# Patient Record
Sex: Female | Born: 1950 | Race: White | Hispanic: No | Marital: Married | Smoking: Former smoker
Health system: Southern US, Community
[De-identification: ages and names within clinical notes are randomized; demographics above are authoritative.]

## PROBLEM LIST (undated history)

## (undated) DIAGNOSIS — I10 Essential (primary) hypertension: Secondary | ICD-10-CM

## (undated) DIAGNOSIS — N6002 Solitary cyst of left breast: Secondary | ICD-10-CM

## (undated) DIAGNOSIS — T4145XA Adverse effect of unspecified anesthetic, initial encounter: Secondary | ICD-10-CM

## (undated) DIAGNOSIS — J189 Pneumonia, unspecified organism: Secondary | ICD-10-CM

## (undated) DIAGNOSIS — J449 Chronic obstructive pulmonary disease, unspecified: Secondary | ICD-10-CM

## (undated) DIAGNOSIS — T8859XA Other complications of anesthesia, initial encounter: Secondary | ICD-10-CM

## (undated) DIAGNOSIS — Z9889 Other specified postprocedural states: Secondary | ICD-10-CM

## (undated) DIAGNOSIS — IMO0001 Reserved for inherently not codable concepts without codable children: Secondary | ICD-10-CM

## (undated) HISTORY — PX: TUBAL LIGATION: SHX77

---

## 1984-03-31 HISTORY — PX: BREAST SURGERY: SHX581

## 1993-03-31 DIAGNOSIS — N6002 Solitary cyst of left breast: Secondary | ICD-10-CM

## 1993-03-31 HISTORY — DX: Solitary cyst of left breast: N60.02

## 1998-07-16 ENCOUNTER — Ambulatory Visit (HOSPITAL_BASED_OUTPATIENT_CLINIC_OR_DEPARTMENT_OTHER): Admission: RE | Admit: 1998-07-16 | Discharge: 1998-07-16 | Payer: Self-pay | Admitting: Orthopedic Surgery

## 1999-09-13 ENCOUNTER — Encounter: Payer: Self-pay | Admitting: Unknown Physician Specialty

## 1999-09-13 ENCOUNTER — Encounter: Admission: RE | Admit: 1999-09-13 | Discharge: 1999-09-13 | Payer: Self-pay | Admitting: Unknown Physician Specialty

## 2002-05-16 ENCOUNTER — Encounter: Payer: Self-pay | Admitting: Unknown Physician Specialty

## 2002-05-16 ENCOUNTER — Encounter: Admission: RE | Admit: 2002-05-16 | Discharge: 2002-05-16 | Payer: Self-pay | Admitting: Unknown Physician Specialty

## 2002-12-27 ENCOUNTER — Encounter: Payer: Self-pay | Admitting: Emergency Medicine

## 2002-12-27 ENCOUNTER — Emergency Department (HOSPITAL_COMMUNITY): Admission: AC | Admit: 2002-12-27 | Discharge: 2002-12-27 | Payer: Self-pay

## 2003-11-30 DIAGNOSIS — Z9889 Other specified postprocedural states: Secondary | ICD-10-CM

## 2003-11-30 HISTORY — DX: Other specified postprocedural states: Z98.890

## 2011-08-04 ENCOUNTER — Encounter (HOSPITAL_BASED_OUTPATIENT_CLINIC_OR_DEPARTMENT_OTHER): Payer: Self-pay | Attending: Internal Medicine

## 2011-09-01 ENCOUNTER — Encounter (HOSPITAL_BASED_OUTPATIENT_CLINIC_OR_DEPARTMENT_OTHER): Payer: Self-pay | Attending: Internal Medicine

## 2014-01-02 DIAGNOSIS — Z6832 Body mass index (BMI) 32.0-32.9, adult: Secondary | ICD-10-CM | POA: Diagnosis not present

## 2014-01-02 DIAGNOSIS — F419 Anxiety disorder, unspecified: Secondary | ICD-10-CM | POA: Diagnosis not present

## 2014-01-02 DIAGNOSIS — I878 Other specified disorders of veins: Secondary | ICD-10-CM | POA: Diagnosis not present

## 2014-01-02 DIAGNOSIS — I10 Essential (primary) hypertension: Secondary | ICD-10-CM | POA: Diagnosis not present

## 2014-01-02 DIAGNOSIS — Z1389 Encounter for screening for other disorder: Secondary | ICD-10-CM | POA: Diagnosis not present

## 2014-01-02 DIAGNOSIS — H3581 Retinal edema: Secondary | ICD-10-CM | POA: Diagnosis not present

## 2014-01-02 DIAGNOSIS — H34832 Tributary (branch) retinal vein occlusion, left eye: Secondary | ICD-10-CM | POA: Diagnosis not present

## 2014-01-02 DIAGNOSIS — Z23 Encounter for immunization: Secondary | ICD-10-CM | POA: Diagnosis not present

## 2014-01-02 DIAGNOSIS — J449 Chronic obstructive pulmonary disease, unspecified: Secondary | ICD-10-CM | POA: Diagnosis not present

## 2014-01-02 DIAGNOSIS — M810 Age-related osteoporosis without current pathological fracture: Secondary | ICD-10-CM | POA: Diagnosis not present

## 2014-01-12 DIAGNOSIS — Z6832 Body mass index (BMI) 32.0-32.9, adult: Secondary | ICD-10-CM | POA: Diagnosis not present

## 2014-01-12 DIAGNOSIS — J449 Chronic obstructive pulmonary disease, unspecified: Secondary | ICD-10-CM | POA: Diagnosis not present

## 2014-02-08 DIAGNOSIS — Z6832 Body mass index (BMI) 32.0-32.9, adult: Secondary | ICD-10-CM | POA: Diagnosis not present

## 2014-02-08 DIAGNOSIS — M810 Age-related osteoporosis without current pathological fracture: Secondary | ICD-10-CM | POA: Diagnosis not present

## 2014-02-08 DIAGNOSIS — Z79899 Other long term (current) drug therapy: Secondary | ICD-10-CM | POA: Diagnosis not present

## 2014-03-02 ENCOUNTER — Ambulatory Visit
Admission: RE | Admit: 2014-03-02 | Discharge: 2014-03-02 | Disposition: A | Discharge status: Home / Self Care | Payer: Self-pay | Source: Ambulatory Visit | Attending: Family Medicine | Admitting: Family Medicine

## 2014-03-02 ENCOUNTER — Other Ambulatory Visit: Payer: Self-pay | Admitting: Family Medicine

## 2014-03-02 DIAGNOSIS — Z006 Encounter for examination for normal comparison and control in clinical research program: Secondary | ICD-10-CM

## 2014-03-03 DIAGNOSIS — I10 Essential (primary) hypertension: Secondary | ICD-10-CM | POA: Diagnosis not present

## 2014-03-03 DIAGNOSIS — M859 Disorder of bone density and structure, unspecified: Secondary | ICD-10-CM | POA: Diagnosis not present

## 2014-03-06 DIAGNOSIS — H3581 Retinal edema: Secondary | ICD-10-CM | POA: Diagnosis not present

## 2014-03-06 DIAGNOSIS — H34832 Tributary (branch) retinal vein occlusion, left eye: Secondary | ICD-10-CM | POA: Diagnosis not present

## 2014-03-07 ENCOUNTER — Encounter (HOSPITAL_COMMUNITY): Payer: Self-pay

## 2014-03-13 ENCOUNTER — Other Ambulatory Visit: Payer: Self-pay | Admitting: Internal Medicine

## 2014-03-13 DIAGNOSIS — M810 Age-related osteoporosis without current pathological fracture: Secondary | ICD-10-CM | POA: Diagnosis not present

## 2014-03-13 DIAGNOSIS — I10 Essential (primary) hypertension: Secondary | ICD-10-CM | POA: Diagnosis not present

## 2014-03-13 DIAGNOSIS — Z1231 Encounter for screening mammogram for malignant neoplasm of breast: Secondary | ICD-10-CM

## 2014-03-13 DIAGNOSIS — J449 Chronic obstructive pulmonary disease, unspecified: Secondary | ICD-10-CM | POA: Diagnosis not present

## 2014-03-13 DIAGNOSIS — H3530 Unspecified macular degeneration: Secondary | ICD-10-CM | POA: Diagnosis not present

## 2014-03-13 DIAGNOSIS — F419 Anxiety disorder, unspecified: Secondary | ICD-10-CM | POA: Diagnosis not present

## 2014-03-13 DIAGNOSIS — Z1389 Encounter for screening for other disorder: Secondary | ICD-10-CM | POA: Diagnosis not present

## 2014-03-13 DIAGNOSIS — E559 Vitamin D deficiency, unspecified: Secondary | ICD-10-CM | POA: Diagnosis not present

## 2014-03-13 DIAGNOSIS — Z0000 Encounter for general adult medical examination without abnormal findings: Secondary | ICD-10-CM | POA: Diagnosis not present

## 2014-03-15 DIAGNOSIS — Z1212 Encounter for screening for malignant neoplasm of rectum: Secondary | ICD-10-CM | POA: Diagnosis not present

## 2014-04-05 ENCOUNTER — Ambulatory Visit
Admission: RE | Admit: 2014-04-05 | Discharge: 2014-04-05 | Disposition: A | Discharge status: Home / Self Care | Payer: Self-pay | Source: Ambulatory Visit | Attending: Internal Medicine | Admitting: Internal Medicine

## 2014-04-05 DIAGNOSIS — Z1231 Encounter for screening mammogram for malignant neoplasm of breast: Secondary | ICD-10-CM | POA: Diagnosis not present

## 2014-05-03 DIAGNOSIS — Z79899 Other long term (current) drug therapy: Secondary | ICD-10-CM | POA: Diagnosis not present

## 2014-05-03 DIAGNOSIS — Z6832 Body mass index (BMI) 32.0-32.9, adult: Secondary | ICD-10-CM | POA: Diagnosis not present

## 2014-05-03 DIAGNOSIS — M810 Age-related osteoporosis without current pathological fracture: Secondary | ICD-10-CM | POA: Diagnosis not present

## 2014-05-03 DIAGNOSIS — E559 Vitamin D deficiency, unspecified: Secondary | ICD-10-CM | POA: Diagnosis not present

## 2014-05-18 DIAGNOSIS — H3581 Retinal edema: Secondary | ICD-10-CM | POA: Diagnosis not present

## 2014-05-18 DIAGNOSIS — H34832 Tributary (branch) retinal vein occlusion, left eye: Secondary | ICD-10-CM | POA: Diagnosis not present

## 2014-05-31 ENCOUNTER — Ambulatory Visit (HOSPITAL_COMMUNITY)
Admission: RE | Admit: 2014-05-31 | Discharge: 2014-05-31 | Disposition: A | Discharge status: Home / Self Care | Payer: Medicare Other | Source: Ambulatory Visit | Attending: Internal Medicine | Admitting: Internal Medicine

## 2014-05-31 ENCOUNTER — Encounter (HOSPITAL_COMMUNITY): Payer: Self-pay

## 2014-05-31 DIAGNOSIS — M810 Age-related osteoporosis without current pathological fracture: Secondary | ICD-10-CM | POA: Diagnosis not present

## 2014-05-31 HISTORY — DX: Essential (primary) hypertension: I10

## 2014-05-31 HISTORY — DX: Chronic obstructive pulmonary disease, unspecified: J44.9

## 2014-05-31 HISTORY — DX: Reserved for inherently not codable concepts without codable children: IMO0001

## 2014-05-31 MED ORDER — SODIUM CHLORIDE 0.9 % IV SOLN
250.0000 mL | Freq: Once | INTRAVENOUS | Status: AC
  Administered 2014-05-31: 250 mL via INTRAVENOUS

## 2014-05-31 MED ORDER — ZOLEDRONIC ACID 5 MG/100ML IV SOLN
5.0000 mg | Freq: Once | INTRAVENOUS | Status: AC
  Administered 2014-05-31: 5 mg via INTRAVENOUS
  Filled 2014-05-31: qty 100

## 2014-05-31 NOTE — Progress Notes (Signed)
Patient is doing COPD research and using their research inhalers

## 2014-06-05 ENCOUNTER — Ambulatory Visit (HOSPITAL_COMMUNITY): Payer: Medicare Other

## 2014-06-14 DIAGNOSIS — M810 Age-related osteoporosis without current pathological fracture: Secondary | ICD-10-CM | POA: Diagnosis not present

## 2014-06-14 DIAGNOSIS — E559 Vitamin D deficiency, unspecified: Secondary | ICD-10-CM | POA: Diagnosis not present

## 2014-07-17 DIAGNOSIS — Z6831 Body mass index (BMI) 31.0-31.9, adult: Secondary | ICD-10-CM | POA: Diagnosis not present

## 2014-07-17 DIAGNOSIS — J209 Acute bronchitis, unspecified: Secondary | ICD-10-CM | POA: Diagnosis not present

## 2014-07-17 DIAGNOSIS — R05 Cough: Secondary | ICD-10-CM | POA: Diagnosis not present

## 2014-07-17 DIAGNOSIS — R0602 Shortness of breath: Secondary | ICD-10-CM | POA: Diagnosis not present

## 2014-07-17 DIAGNOSIS — J45901 Unspecified asthma with (acute) exacerbation: Secondary | ICD-10-CM | POA: Diagnosis not present

## 2014-08-03 DIAGNOSIS — H3581 Retinal edema: Secondary | ICD-10-CM | POA: Diagnosis not present

## 2014-08-03 DIAGNOSIS — H34832 Tributary (branch) retinal vein occlusion, left eye: Secondary | ICD-10-CM | POA: Diagnosis not present

## 2014-09-05 DIAGNOSIS — Z6832 Body mass index (BMI) 32.0-32.9, adult: Secondary | ICD-10-CM | POA: Diagnosis not present

## 2014-09-05 DIAGNOSIS — J449 Chronic obstructive pulmonary disease, unspecified: Secondary | ICD-10-CM | POA: Diagnosis not present

## 2014-10-19 DIAGNOSIS — H3581 Retinal edema: Secondary | ICD-10-CM | POA: Diagnosis not present

## 2014-10-19 DIAGNOSIS — H34832 Tributary (branch) retinal vein occlusion, left eye: Secondary | ICD-10-CM | POA: Diagnosis not present

## 2014-12-28 DIAGNOSIS — S83241A Other tear of medial meniscus, current injury, right knee, initial encounter: Secondary | ICD-10-CM | POA: Diagnosis not present

## 2015-01-04 DIAGNOSIS — H34832 Tributary (branch) retinal vein occlusion, left eye, with macular edema: Secondary | ICD-10-CM | POA: Diagnosis not present

## 2015-01-06 DIAGNOSIS — J45901 Unspecified asthma with (acute) exacerbation: Secondary | ICD-10-CM | POA: Diagnosis not present

## 2015-01-27 DIAGNOSIS — Z23 Encounter for immunization: Secondary | ICD-10-CM | POA: Diagnosis not present

## 2015-03-08 ENCOUNTER — Other Ambulatory Visit: Payer: Self-pay

## 2015-03-08 DIAGNOSIS — Z1231 Encounter for screening mammogram for malignant neoplasm of breast: Secondary | ICD-10-CM

## 2015-03-12 DIAGNOSIS — R7301 Impaired fasting glucose: Secondary | ICD-10-CM | POA: Diagnosis not present

## 2015-03-12 DIAGNOSIS — E559 Vitamin D deficiency, unspecified: Secondary | ICD-10-CM | POA: Diagnosis not present

## 2015-03-12 DIAGNOSIS — N390 Urinary tract infection, site not specified: Secondary | ICD-10-CM | POA: Diagnosis not present

## 2015-03-12 DIAGNOSIS — R8290 Unspecified abnormal findings in urine: Secondary | ICD-10-CM | POA: Diagnosis not present

## 2015-03-12 DIAGNOSIS — I10 Essential (primary) hypertension: Secondary | ICD-10-CM | POA: Diagnosis not present

## 2015-03-16 DIAGNOSIS — E559 Vitamin D deficiency, unspecified: Secondary | ICD-10-CM | POA: Diagnosis not present

## 2015-03-16 DIAGNOSIS — Z6833 Body mass index (BMI) 33.0-33.9, adult: Secondary | ICD-10-CM | POA: Diagnosis not present

## 2015-03-16 DIAGNOSIS — J449 Chronic obstructive pulmonary disease, unspecified: Secondary | ICD-10-CM | POA: Diagnosis not present

## 2015-03-16 DIAGNOSIS — Z23 Encounter for immunization: Secondary | ICD-10-CM | POA: Diagnosis not present

## 2015-03-16 DIAGNOSIS — I10 Essential (primary) hypertension: Secondary | ICD-10-CM | POA: Diagnosis not present

## 2015-03-16 DIAGNOSIS — Z0000 Encounter for general adult medical examination without abnormal findings: Secondary | ICD-10-CM | POA: Diagnosis not present

## 2015-03-16 DIAGNOSIS — R7301 Impaired fasting glucose: Secondary | ICD-10-CM | POA: Diagnosis not present

## 2015-03-16 DIAGNOSIS — M810 Age-related osteoporosis without current pathological fracture: Secondary | ICD-10-CM | POA: Diagnosis not present

## 2015-03-16 DIAGNOSIS — H3530 Unspecified macular degeneration: Secondary | ICD-10-CM | POA: Diagnosis not present

## 2015-03-16 DIAGNOSIS — F419 Anxiety disorder, unspecified: Secondary | ICD-10-CM | POA: Diagnosis not present

## 2015-03-22 DIAGNOSIS — H34832 Tributary (branch) retinal vein occlusion, left eye, with macular edema: Secondary | ICD-10-CM | POA: Diagnosis not present

## 2015-04-03 ENCOUNTER — Telehealth (HOSPITAL_COMMUNITY): Payer: Self-pay

## 2015-04-03 NOTE — Telephone Encounter (Signed)
I have called and left a message with Aslan to inquire about participation in Pulmonary Rehab per Dr. Holwerda's referral. Will send letter in mail and follow up. 

## 2015-04-05 ENCOUNTER — Telehealth (HOSPITAL_COMMUNITY): Payer: Self-pay

## 2015-04-05 DIAGNOSIS — Z6833 Body mass index (BMI) 33.0-33.9, adult: Secondary | ICD-10-CM | POA: Diagnosis not present

## 2015-04-05 DIAGNOSIS — J019 Acute sinusitis, unspecified: Secondary | ICD-10-CM | POA: Diagnosis not present

## 2015-04-05 DIAGNOSIS — Z1389 Encounter for screening for other disorder: Secondary | ICD-10-CM | POA: Diagnosis not present

## 2015-04-05 NOTE — Telephone Encounter (Signed)
I have called and left a message with Sharma CovertLanice to inquire about participation in Pulmonary Rehab per Dr. Alphonsus SiasHolwerda's referral. Will send letter in mail and follow up.

## 2015-04-10 ENCOUNTER — Ambulatory Visit: Payer: Self-pay

## 2015-04-25 ENCOUNTER — Ambulatory Visit
Admission: RE | Admit: 2015-04-25 | Discharge: 2015-04-25 | Disposition: A | Discharge status: Home / Self Care | Payer: Medicare Other | Source: Ambulatory Visit

## 2015-04-25 DIAGNOSIS — Z1231 Encounter for screening mammogram for malignant neoplasm of breast: Secondary | ICD-10-CM | POA: Diagnosis not present

## 2015-04-30 ENCOUNTER — Encounter (HOSPITAL_COMMUNITY)
Admission: RE | Admit: 2015-04-30 | Discharge: 2015-04-30 | Disposition: A | Discharge status: Home / Self Care | Payer: Medicare Other | Source: Ambulatory Visit | Attending: Internal Medicine | Admitting: Internal Medicine

## 2015-04-30 ENCOUNTER — Encounter (HOSPITAL_COMMUNITY): Payer: Self-pay

## 2015-04-30 DIAGNOSIS — J45909 Unspecified asthma, uncomplicated: Secondary | ICD-10-CM | POA: Insufficient documentation

## 2015-04-30 DIAGNOSIS — R0602 Shortness of breath: Secondary | ICD-10-CM | POA: Insufficient documentation

## 2015-04-30 DIAGNOSIS — R0600 Dyspnea, unspecified: Secondary | ICD-10-CM

## 2015-04-30 HISTORY — DX: Solitary cyst of left breast: N60.02

## 2015-04-30 HISTORY — DX: Other specified postprocedural states: Z98.890

## 2015-04-30 NOTE — Progress Notes (Signed)
Meghan [name] 65 y.o. female Pulmonary Rehab Orientation Note Patient arrived today in [facility] for orientation to Pulmonary Rehab. She was transported from [facility] via wheel chair accompanied by her husband. She has not been prescribed oxygen for home use. Color good, skin warm and dry. Patient is oriented to time and place. Patient's medical history, psychosocial health, and medications reviewed. Psychosocial assessment reveals pt lives with their spouse. They have 2 daughters, one locally and one lives in [place] with her family. They have multiple grandchildren. Pt is currently retired from [employer]. Pt hobbies include looking at old houses with her husband, visiting the residents of nursing homes, and caring for their new dog. Pt reports her stress level is extremely low. She states she really does not have any aspects of her life that causes her worry or anxiety. She states she is very fortunate to be in good health except for her COPD/Asthma/Dyspnea.  Pt does not exhibit signs of depression. PHQ2/9 score 0/na. Pt shows good  coping skills with positive outlook . She was offered emotional support and reassurance. Will continue to monitor and evaluate progress toward psychosocial goal(s) of maintaining a low stress lifestyle and remaining positive about her future. Physical assessment reveals heart rate is normal, S1S2 present. Breath sounds clear to auscultation, no wheezes, rales, or rhonchi. Grip strength equal, strong. Distal pulses palpable. Discoloration noted to lower legs bilat R>L. Mild non-pitting edema also noted to her ankles and lower legs R>L. Patient states the skin to painful to touch on the right leg and MD is aware. Encouraged patient to wear compression stockings for her venous stasis, but she states it is too painful to put them on.  Patient reports she does take medications as prescribed. Patient states she follows a Regular diet. The patient reports no  specific efforts to gain or lose weight. One of her personal goals while in the program is to begin healthy weight loss through diet and exercise. Patient's weight will be monitored closely. Demonstration and practice of PLB using pulse oximeter. Patient able to return demonstration satisfactorily. Safety and hand hygiene in the exercise area reviewed with patient. Patient voices understanding of the information reviewed. Department expectations discussed with patient and achievable goals were set. The patient shows enthusiasm about attending the program and we look forward to working with this nice lady. The patient is scheduled for a 6 min walk test on Tuesday, 1/30 at 4:00 and to begin exercise on Thursday 2/2 in the 1:30 class.   45 minutes was spent on a variety of activities such as assessment of the patient, obtaining baseline data including height, weight, BMI, and grip strength, verifying medical history, allergies, and current medications, and teaching patient strategies for performing tasks with less respiratory effort with emphasis on pursed lip breathing.

## 2015-05-01 ENCOUNTER — Encounter (HOSPITAL_COMMUNITY)
Admission: RE | Admit: 2015-05-01 | Discharge: 2015-05-01 | Disposition: A | Discharge status: Home / Self Care | Payer: Medicare Other | Source: Ambulatory Visit | Attending: Internal Medicine | Admitting: Internal Medicine

## 2015-05-01 DIAGNOSIS — R0602 Shortness of breath: Secondary | ICD-10-CM | POA: Diagnosis not present

## 2015-05-01 NOTE — Progress Notes (Signed)
Meghan [name] completed a Six-Minute Walk Test on 05/01/15 . Tu walked 835 feet with 1 rest break for 1 minute.  The patient's lowest oxygen saturation was 86 %, highest heart rate was 109 bpm , and highest blood pressure was 196/94. The patient was on room ait. Patient stated that shortness of breath hindered her walk test.  Pt was introduced to pursed lip breathing after walk test.  We will monitor oxygen saturation and blood pressure closely during exercise. Fabio Pierce, MA, ACSM RCEP

## 2015-05-03 ENCOUNTER — Encounter (HOSPITAL_COMMUNITY)
Admission: RE | Admit: 2015-05-03 | Discharge: 2015-05-03 | Disposition: A | Discharge status: Home / Self Care | Payer: Medicare Other | Source: Ambulatory Visit | Attending: Internal Medicine | Admitting: Internal Medicine

## 2015-05-03 DIAGNOSIS — R0602 Shortness of breath: Secondary | ICD-10-CM | POA: Insufficient documentation

## 2015-05-03 NOTE — Progress Notes (Signed)
Today, Meghan [name] exercised at [employer]. [name]. Service time was from 1330 to 1515.  The patient exercised by performing aerobic, strengthening, and stretching exercises. Oxygen saturation, heart rate, blood pressure, rate of perceived exertion, and shortness of breath were all monitored before, during, and after exercise. [name] presented with no problems at today's exercise session. [name] also attended an education session with the respiratory therapist on pulmonary medications and PLB.  The patient did not have an increase in workload intensity during today's exercise session.  Pre-exercise vitals: . Weight kg: 84.9 . Liters of O2: ra . SpO2: 93 . HR: 78 . BP: 144/72 . CBG: na  Exercise vitals: . Highest heartrate:  101 . Lowest oxygen saturation: 86 increased to 90 with PLB . Highest blood pressure: 132/70 . Liters of 02: ra  Post-exercise vitals: . SpO2: 95 . HR: 87 . BP: 124/72 . Liters of O2: ra . CBG: na  Dr. Alyson Reedy, Medical Director Dr. Ardyth Harps is immediately available during today's Pulmonary Rehab session for [facility] on 05/03/2015 at 1330 class time.

## 2015-05-08 ENCOUNTER — Encounter (HOSPITAL_COMMUNITY)
Admission: RE | Admit: 2015-05-08 | Discharge: 2015-05-08 | Disposition: A | Discharge status: Home / Self Care | Payer: Medicare Other | Source: Ambulatory Visit | Attending: Internal Medicine | Admitting: Internal Medicine

## 2015-05-08 DIAGNOSIS — E559 Vitamin D deficiency, unspecified: Secondary | ICD-10-CM | POA: Diagnosis not present

## 2015-05-08 DIAGNOSIS — M810 Age-related osteoporosis without current pathological fracture: Secondary | ICD-10-CM | POA: Diagnosis not present

## 2015-05-08 DIAGNOSIS — Z6833 Body mass index (BMI) 33.0-33.9, adult: Secondary | ICD-10-CM | POA: Diagnosis not present

## 2015-05-08 DIAGNOSIS — R0602 Shortness of breath: Secondary | ICD-10-CM | POA: Diagnosis not present

## 2015-05-08 NOTE — Progress Notes (Signed)
Today, Meghan [name] exercised at [facility]. Service time was from 1330 to 1510.  The patient exercised by performing aerobic, strengthening, and stretching exercises. Oxygen saturation, heart rate, blood pressure, rate of perceived exertion, and shortness of breath were all monitored before, during, and after exercise. Meghan [name] presented with no problems at today's exercise session.  The patient did not have an increase in workload intensity during today's exercise session.  Pre-exercise vitals: . Weight kg: 84.8 . Liters of O2: ra . SpO2: 93 . HR: 82 . BP: 136/70 . CBG: na  Exercise vitals: . Highest heartrate:  111 . Lowest oxygen saturation: 85 which increased to 91 with rest and purse lip breathing . Highest blood pressure: 162/80 . Liters of 02: ra  Post-exercise vitals: . SpO2: 94 . HR: 84 . BP: 144/80 . Liters of O2: ra . CBG: na Dr. Alyson Reedy, Medical Director Dr. [name] is immediately available during today's Pulmonary Rehab session for Meghan [name] on 05/08/2015  at 1330 class time  .

## 2015-05-10 ENCOUNTER — Encounter (HOSPITAL_COMMUNITY)
Admission: RE | Admit: 2015-05-10 | Discharge: 2015-05-10 | Disposition: A | Discharge status: Home / Self Care | Payer: Medicare Other | Source: Ambulatory Visit | Attending: Internal Medicine | Admitting: Internal Medicine

## 2015-05-10 DIAGNOSIS — R0602 Shortness of breath: Secondary | ICD-10-CM | POA: Diagnosis not present

## 2015-05-10 NOTE — Progress Notes (Signed)
Today, Meghan [name] exercised at [facility] [facility]. Service time was from 1330 to 1520.  The patient exercised by performing aerobic, strengthening, and stretching exercises. Oxygen saturation, heart rate, blood pressure, rate of perceived exertion, and shortness of breath were all monitored before, during, and after exercise. Meghan [name] presented with no problems at today's exercise session. Meghan [name] also attended an education with [facility] on oxygen equipment.  The patient did not have an increase in workload intensity during today's exercise session.  Pre-exercise vitals: . Weight kg: 84.9 . Liters of O2: ra . SpO2: 93 . HR: 85 . BP: 124/70 . CBG: na  Exercise vitals: . Highest heartrate:  104 . Lowest oxygen saturation: 88 . Highest blood pressure: 122/70 . Liters of 02: ra  Post-exercise vitals: . SpO2: 97 . HR: 89 . BP: 118/76 . Liters of O2: ra . CBG: na  Dr. Alyson Reedy, Medical Director Dr. [name] is immediately available during today's Pulmonary Rehab session for [facility] on 05/10/2015 at 1330 class time.

## 2015-05-15 ENCOUNTER — Encounter (HOSPITAL_COMMUNITY)
Admission: RE | Admit: 2015-05-15 | Discharge: 2015-05-15 | Disposition: A | Discharge status: Home / Self Care | Payer: Medicare Other | Source: Ambulatory Visit | Attending: Internal Medicine | Admitting: Internal Medicine

## 2015-05-15 DIAGNOSIS — R0602 Shortness of breath: Secondary | ICD-10-CM | POA: Diagnosis not present

## 2015-05-15 NOTE — Progress Notes (Signed)
Today, Meghan [name] exercised at [employer] [facility]. Service time was from 1330 to 1510.  The patient exercised by performing aerobic, strengthening, and stretching exercises. Oxygen saturation, heart rate, blood pressure, rate of perceived exertion, and shortness of breath were all monitored before, during, and after exercise. Meghan [name] presented with no problems at today's exercise session.  The patient did  have an increase in workload intensity during today's exercise session.  Pre-exercise vitals: . Weight kg: 84.3 . Liters of O2: RA . SpO2: 97 . HR: 80 . BP: 130/80 . CBG: NA  Exercise vitals: . Highest heartrate:  109 . Lowest oxygen saturation: 90 . Highest blood pressure: 110/70 . Liters of 02: RA  Post-exercise vitals: . SpO2: 96 . HR: 87 . BP: 122/78 . Liters of O2: RA . CBG: NA Dr. Alyson Reedy, Medical Director Dr. [name] is immediately available during today's Pulmonary Rehab session for [facility] on 05/15/2015  at 1330 class time  .

## 2015-05-17 ENCOUNTER — Encounter (HOSPITAL_COMMUNITY)
Admission: RE | Admit: 2015-05-17 | Discharge: 2015-05-17 | Disposition: A | Discharge status: Home / Self Care | Payer: Medicare Other | Source: Ambulatory Visit | Attending: Internal Medicine | Admitting: Internal Medicine

## 2015-05-17 ENCOUNTER — Other Ambulatory Visit (HOSPITAL_COMMUNITY): Payer: Self-pay | Admitting: *Deleted

## 2015-05-17 DIAGNOSIS — R0602 Shortness of breath: Secondary | ICD-10-CM | POA: Diagnosis not present

## 2015-05-17 NOTE — Progress Notes (Signed)
Today, Meghan [name] exercised at [employer] [facility]. Service time was from 1330 to 1515.  The patient exercised by performing aerobic, strengthening, and stretching exercises. Oxygen saturation, heart rate, blood pressure, rate of perceived exertion, and shortness of breath were all monitored before, during, and after exercise. Meghan [name] presented with no problems at today's exercise session. She attended warning signs and symptoms class today.  The patient did not have an increase in workload intensity during today's exercise session.  Pre-exercise vitals: . Weight kg: 84.5 . Liters of O2: RA . SpO2: 95 . HR: 82 . BP: 130/64 . CBG: NA  Exercise vitals: . Highest heartrate:  94 . Lowest oxygen saturation: 90 . Highest blood pressure: 144/72 . Liters of 02: RA  Post-exercise vitals: . SpO2: 99 . HR: 83 . BP: 120/60 . Liters of O2: RA . CBG: NA Dr. Alyson Reedy, Medical Director Dr. Benjamine Mola is immediately available during today's Pulmonary Rehab session for [facility] on 05/17/2015  at 1330 class time   .

## 2015-05-17 NOTE — Progress Notes (Signed)
Antoine Alviar 65 y.o. female Nutrition Note Spoke with pt. Pt is obese. Pt eats 1 meal a day; most prepared at home. Discussed how eating more frequently is beneficial for pulmonary patients. There are some ways the pt can make her eating habits healthier.  Pt's Rate Your Plate results reviewed with pt. Pt does not avoid salty food; uses her own canned food.  Pt does not add salt to food. The role of sodium in lung disease reviewed with pt. Pt expressed understanding of the information reviewed.  No results found for: HGBA1C  Nutrition Diagnosis ? Food-and nutrition-related knowledge deficit related to lack of exposure to information as related to diagnosis of pulmonary disease ? Obesity related to excessive energy intake as evidenced by a BMI of 33.6 ?  Nutrition Intervention ? Pt's individual nutrition plan and goals reviewed with pt. ? Benefits of adopting healthy eating habits discussed when pt's Rate Your Plate reviewed. ? Pt to attend the Nutrition and Lung Disease class ? Continual client-centered nutrition education by RD, as part of interdisciplinary care. Goal(s) 1. Identify food quantities necessary to achieve wt loss of  -2# per week to a goal wt loss of 2.7-10.9 kg (6-24 lb) at graduation from pulmonary rehab. 2. Describe the benefit of including fruits, vegetables, whole grains, and low-fat dairy products in a healthy meal plan. Monitor and Evaluate progress toward nutrition goal with team.   Mickle Plumb, M.Ed, RD, LDN, CDE 05/17/2015 2:47 PM

## 2015-05-18 ENCOUNTER — Ambulatory Visit (HOSPITAL_COMMUNITY)
Admission: RE | Admit: 2015-05-18 | Discharge: 2015-05-18 | Disposition: A | Discharge status: Home / Self Care | Payer: Medicare Other | Source: Ambulatory Visit | Attending: Internal Medicine | Admitting: Internal Medicine

## 2015-05-18 DIAGNOSIS — M810 Age-related osteoporosis without current pathological fracture: Secondary | ICD-10-CM | POA: Insufficient documentation

## 2015-05-18 MED ORDER — ZOLEDRONIC ACID 5 MG/100ML IV SOLN: 5.0000 mg | Freq: Once | INTRAVENOUS | Status: DC

## 2015-05-18 MED ORDER — ZOLEDRONIC ACID 5 MG/100ML IV SOLN
INTRAVENOUS | Status: AC
  Administered 2015-05-18: 5 mg
  Filled 2015-05-18: qty 100

## 2015-05-22 ENCOUNTER — Encounter (HOSPITAL_COMMUNITY)
Admission: RE | Admit: 2015-05-22 | Discharge: 2015-05-22 | Disposition: A | Discharge status: Home / Self Care | Payer: Medicare Other | Source: Ambulatory Visit | Attending: Internal Medicine | Admitting: Internal Medicine

## 2015-05-22 DIAGNOSIS — R0602 Shortness of breath: Secondary | ICD-10-CM | POA: Diagnosis not present

## 2015-05-22 NOTE — Progress Notes (Signed)
Today, Meghan [name] exercised at [employer]. Service time was from 1330 to 1455.  The patient exercised by performing aerobic, strengthening, and stretching exercises. Oxygen saturation, heart rate, blood pressure, rate of perceived exertion, and shortness of breath were all monitored before, during, and after exercise. Meghan [name] presented with no problems at today's exercise session.  The patient did  have an increase in workload intensity during today's exercise session.  Pre-exercise vitals: . Weight kg: 85.2 . Liters of O2: ra . SpO2: 96 . HR: 87 . BP: 118/60 . CBG: na  Exercise vitals: . Highest heartrate:  105 . Lowest oxygen saturation: 93 . Highest blood pressure: 122/70 . Liters of 02: ra  Post-exercise vitals: . SpO2: 95 . HR: 91 . BP: 124/70 . Liters of O2: ra . CBG: na Dr. Alyson Reedy, Medical Director Dr. [name] is immediately available during today's Pulmonary Rehab session for [facility] on 05/22/2015  at 1330 class time  .

## 2015-05-24 ENCOUNTER — Encounter (HOSPITAL_COMMUNITY)
Admission: RE | Admit: 2015-05-24 | Discharge: 2015-05-24 | Disposition: A | Discharge status: Home / Self Care | Payer: Medicare Other | Source: Ambulatory Visit | Attending: Internal Medicine | Admitting: Internal Medicine

## 2015-05-24 DIAGNOSIS — R0602 Shortness of breath: Secondary | ICD-10-CM | POA: Diagnosis not present

## 2015-05-24 NOTE — Progress Notes (Signed)
I have reviewed a Home Exercise Prescription with [facility] . [name] is currently not exercising at home. However, pt is attending Pulmonary Rehab 2x/week. The patient was advised to walk or attend [employer] with spouse 2 days a week for 30 minutes.  [name] and I discussed how to progress her exercise prescription.  The patient stated that their goals were to lose 10lbs and continue with PLB and diaphragmatic breathing.  The patient stated that they understand the exercise prescription.  We reviewed exercise guidelines, target heart rate during exercise, oxygen use, weather, home pulse oximeter, endpoints for exercise, and goals.  Patient is encouraged to come to me with any questions. I will continue to follow up with the patient to assist them with progression and safety.     Meghan [name] [employer]

## 2015-05-24 NOTE — Progress Notes (Signed)
Today, Meghan [name] exercised at [employer]. Service time was from 1330 to 1525.  The patient exercised by performing aerobic, strengthening, and stretching exercises. Oxygen saturation, heart rate, blood pressure, rate of perceived exertion, and shortness of breath were all monitored before, during, and after exercise. Meghan [name] presented with no problems at today's exercise session. She attended exercise for the pulmonary patient class today.  The patient did not have an increase in workload intensity during today's exercise session.  Pre-exercise vitals: . Weight kg: 85.0 . Liters of O2: RA . SpO2: 96 . HR: 79 . BP: 138/70 . CBG: NA  Exercise vitals: . Highest heartrate:  92 . Lowest oxygen saturation: 89 . Highest blood pressure: 110/70 . Liters of 02: RA  Post-exercise vitals: . SpO2: 97 . HR: 84 . BP: 108/74 . Liters of O2: RA . CBG: NA Dr. Alyson Reedy, Medical Director Dr. [name] is immediately available during today's Pulmonary Rehab session for [employer] [facility] on 05/24/2015  at 1330 class time.  Marland Kitchen

## 2015-05-29 ENCOUNTER — Encounter (HOSPITAL_COMMUNITY)
Admission: RE | Admit: 2015-05-29 | Discharge: 2015-05-29 | Disposition: A | Discharge status: Home / Self Care | Payer: Medicare Other | Source: Ambulatory Visit | Attending: Internal Medicine | Admitting: Internal Medicine

## 2015-05-29 DIAGNOSIS — R0602 Shortness of breath: Secondary | ICD-10-CM | POA: Diagnosis not present

## 2015-05-29 NOTE — Progress Notes (Signed)
Daily Session Note  Patient Details  Name: Meghan [name] MRN: [id number] Date of Birth: 05-Apr-1950 Referring Provider:  Velna Hatchet, MD  Encounter Date: 05/29/2015  Check In:     Session Check In - 05/29/15 1334    Check-In   Location [facility]   Staff Present Rosebud Poles, RN, Deland Pretty, MS, ACSM CEP, Exercise Physiologist;Annedrea Rosezella [place], RN, MHA;Portia Rollene Rotunda, RN, BSN   Supervising physician immediately available to respond to emergencies [facility] immediately available   Physician(s) Dr. Marily Memos   Medication changes reported     No   Fall or balance concerns reported    No   Warm-up and Cool-down Performed as group-led instruction   Resistance Training Performed Yes   VAD Patient? No   Pain Assessment   Currently in Pain? No/denies      Capillary Blood Glucose: No results found for this or any previous visit (from the past 24 hour(s)).      Exercise Prescription Changes - 05/29/15 1500    Exercise Review   Progression No   Response to Exercise   Blood Pressure (Admit) 102/64 mmHg   Blood Pressure (Exercise) 144/68 mmHg   Blood Pressure (Exit) 110/70 mmHg   Heart Rate (Admit) 85 bpm   Heart Rate (Exercise) 108 bpm   Heart Rate (Exit) 95 bpm   Oxygen Saturation (Admit) 96 %   Oxygen Saturation (Exercise) 89 %   Oxygen Saturation (Exit) 95 %   Rating of Perceived Exertion (Exercise) 11   Perceived Dyspnea (Exercise) 1   Symptoms unusal fatigue   Comments house cleaning before exercise   Duration Progress to 45 minutes of aerobic exercise without signs/symptoms of physical distress   Intensity THRR unchanged  40-80% TRR   Progression   Progression Continue to progress workloads to maintain intensity without signs/symptoms of physical distress.   Resistance Training   Training Prescription Yes   Weight orange bands   Reps 10-12   Interval Training   Interval Training No   NuStep   Level 3   Minutes 15   METs  2.1   Arm Ergometer   Level 2   Minutes 15   Track   Laps 7   Minutes 15     Goals Met:  Using PLB without cueing & demonstrates good technique No report of cardiac concerns or symptoms Strength training completed today  Goals Unmet:  Did not meet her stamina goal due to weekend houssework- over exerction  Comments: Service time was from 1330 to 1505    Dr. Rush Farmer is Medical Director for Pulmonary Rehab at [facility].

## 2015-05-31 ENCOUNTER — Encounter (HOSPITAL_COMMUNITY)
Admission: RE | Admit: 2015-05-31 | Discharge: 2015-05-31 | Disposition: A | Discharge status: Home / Self Care | Payer: Medicare Other | Source: Ambulatory Visit | Attending: Internal Medicine | Admitting: Internal Medicine

## 2015-05-31 DIAGNOSIS — R0602 Shortness of breath: Secondary | ICD-10-CM | POA: Diagnosis not present

## 2015-05-31 NOTE — Progress Notes (Signed)
Daily Session Note  Patient Details  Name: Meghan [name] MRN: [id number] Date of Birth: May 03, 1950 Referring Provider:  Velna Hatchet, MD  Encounter Date: 05/31/2015  Check In:     Session Check In - 05/31/15 1506    Check-In   [facility]   Staff Present Rosebud Poles, RN, BSN;Amber Fair, MS, ACSM RCEP, Exercise Physiologist;Maylon Sailors Ysidro Evert, RN   Supervising physician immediately available to respond to emergencies [facility] immediately available   Physician(s) Dr. Marily Memos   Medication changes reported     No   Fall or balance concerns reported    No   Warm-up and Cool-down Performed as group-led instruction   Resistance Training Performed Yes   VAD Patient? No   Pain Assessment   Currently in Pain? No/denies      Capillary Blood Glucose: No results found for this or any previous visit (from the past 24 hour(s)).      Exercise Prescription Changes - 05/31/15 1500    Exercise Review   Progression Yes   Response to Exercise   Blood Pressure (Admit) 138/74 mmHg   Blood Pressure (Exercise) 150/90 mmHg   Blood Pressure (Exit) 104/66 mmHg   Heart Rate (Admit) 78 bpm   Heart Rate (Exercise) 92 bpm   Heart Rate (Exit) 87 bpm   Oxygen Saturation (Admit) 98 %   Oxygen Saturation (Exercise) 92 %   Oxygen Saturation (Exit) 96 %   Rating of Perceived Exertion (Exercise) 11   Perceived Dyspnea (Exercise) 1   Duration Progress to 45 minutes of aerobic exercise without signs/symptoms of physical distress   Intensity Other (comment)  40-80% of THRR   Progression   Progression Continue to progress workloads to maintain intensity without signs/symptoms of physical distress.   Resistance Training   Training Prescription Yes   Weight orange band   Reps 10-12   Interval Training   Interval Training No   NuStep   Level 4   Minutes 15   METs 2.1   Arm Ergometer   Level 2   Minutes 15     Goals Met:  Independence with exercise  equipment Using PLB without cueing & demonstrates good technique Exercise tolerated well No report of cardiac concerns or symptoms Strength training completed today  Goals Unmet:  Not Applicable  Comments: Service time is from 1330 to 1530    Dr. Rush Farmer is Medical Director for Pulmonary Rehab at [facility]

## 2015-06-05 ENCOUNTER — Encounter (HOSPITAL_COMMUNITY): Payer: Medicare Other

## 2015-06-07 ENCOUNTER — Encounter (HOSPITAL_COMMUNITY)
Admission: RE | Admit: 2015-06-07 | Discharge: 2015-06-07 | Disposition: A | Discharge status: Home / Self Care | Payer: Medicare Other | Source: Ambulatory Visit | Attending: Internal Medicine | Admitting: Internal Medicine

## 2015-06-07 DIAGNOSIS — R0602 Shortness of breath: Secondary | ICD-10-CM | POA: Diagnosis not present

## 2015-06-07 NOTE — Progress Notes (Signed)
Daily Session Note  Patient Details  Name: Meghan [name] MRN: [id number] Date of Birth: 11-20-1950 Referring Provider:  Velna Hatchet, MD  Encounter Date: 06/07/2015  Check In:     Session Check In - 06/07/15 1349    Check-In   [facility]   Staff Present Rosebud Poles, RN, BSN;Ramon Dredge, RN, MHA;Jessica Luan Pulling, MA, ACSM RCEP, Exercise Physiologist;Amber Fair, MS, ACSM RCEP, Exercise Physiologist;Dierdra Salameh Rollene Rotunda, RN, BSN   Supervising physician immediately available to respond to emergencies [facility] immediately available   Physician(s) Elgergawy   Medication changes reported     No   Fall or balance concerns reported    No   Warm-up and Cool-down Performed as group-led Location manager Performed Yes   VAD Patient? No   Pain Assessment   Currently in Pain? No/denies   Multiple Pain Sites No      Capillary Blood Glucose: No results found for this or any previous visit (from the past 24 hour(s)).      Exercise Prescription Changes - 06/07/15 1600    Exercise Review   Progression Yes   Response to Exercise   Blood Pressure (Admit) 122/60 mmHg   Blood Pressure (Exercise) 156/66 mmHg   Blood Pressure (Exit) 120/62 mmHg   Heart Rate (Admit) 88 bpm   Heart Rate (Exercise) 95 bpm   Heart Rate (Exit) 95 bpm   Oxygen Saturation (Admit) 95 %   Oxygen Saturation (Exercise) 92 %   Oxygen Saturation (Exit) 91 %   Rating of Perceived Exertion (Exercise) 13   Perceived Dyspnea (Exercise) 1   Symptoms none   Duration Progress to 45 minutes of aerobic exercise without signs/symptoms of physical distress   Intensity THRR unchanged   Progression   Progression Continue to progress workloads to maintain intensity without signs/symptoms of physical distress.   Resistance Training   Training Prescription Yes   Weight orange band   Reps 10-12   Interval Training   Interval Training No   Arm Ergometer   Level 2   Minutes  15   Track   Laps 6   Minutes 16     Goals Met:  Improved SOB with ADL's Exercise tolerated well Queuing for purse lip breathing No report of cardiac concerns or symptoms  Goals Unmet:  Not Applicable  Comments: Service time is from 1330 to 1530   Dr. Rush Farmer is Medical Director for Pulmonary Rehab at [facility].

## 2015-06-12 ENCOUNTER — Encounter (HOSPITAL_COMMUNITY): Payer: Medicare Other

## 2015-06-12 DIAGNOSIS — R05 Cough: Secondary | ICD-10-CM | POA: Diagnosis not present

## 2015-06-12 DIAGNOSIS — R0602 Shortness of breath: Secondary | ICD-10-CM | POA: Diagnosis not present

## 2015-06-12 DIAGNOSIS — Z6833 Body mass index (BMI) 33.0-33.9, adult: Secondary | ICD-10-CM | POA: Diagnosis not present

## 2015-06-12 DIAGNOSIS — J449 Chronic obstructive pulmonary disease, unspecified: Secondary | ICD-10-CM | POA: Diagnosis not present

## 2015-06-12 DIAGNOSIS — I10 Essential (primary) hypertension: Secondary | ICD-10-CM | POA: Diagnosis not present

## 2015-06-12 DIAGNOSIS — J45909 Unspecified asthma, uncomplicated: Secondary | ICD-10-CM | POA: Diagnosis not present

## 2015-06-12 DIAGNOSIS — J019 Acute sinusitis, unspecified: Secondary | ICD-10-CM | POA: Diagnosis not present

## 2015-06-14 ENCOUNTER — Encounter (HOSPITAL_COMMUNITY): Payer: Medicare Other

## 2015-06-19 ENCOUNTER — Encounter (HOSPITAL_COMMUNITY): Payer: Medicare Other

## 2015-06-21 ENCOUNTER — Encounter (HOSPITAL_COMMUNITY): Payer: Medicare Other

## 2015-06-26 ENCOUNTER — Encounter (HOSPITAL_COMMUNITY): Payer: Medicare Other

## 2015-06-28 ENCOUNTER — Encounter (HOSPITAL_COMMUNITY): Payer: Medicare Other

## 2015-06-28 NOTE — Progress Notes (Signed)
Pulmonary Rehab Discharge Note  Meghan [name] has dropped out of the program,  her last day of exercise was June 07, 2015.  She has had a sinus infection and and has not felt well enough to exercise.  Dr. Ardeth Perfect instructed her to drop the program and she feels she accomplished "learning to breathe".  She was able to maintain her oxygen saturations above 88% with purse lip breathing on room air.  When she began the program her oxygen saturations were dropping below 88%, but once she learned to purse lip breathing they improved.She met her program goal on increasing her strength and stamina and decreased her shortness of breath, but she did not meet her weight loss goal.

## 2015-06-28 NOTE — Addendum Note (Signed)
Encounter addended by: Drema PryJoan A Jamiria Langill, RN on: 06/28/2015  9:12 AM<BR>     Documentation filed: Clinical Notes

## 2015-07-03 ENCOUNTER — Encounter (HOSPITAL_COMMUNITY): Payer: Medicare Other

## 2015-07-04 DIAGNOSIS — H33193 Other retinoschisis and retinal cysts, bilateral: Secondary | ICD-10-CM | POA: Diagnosis not present

## 2015-07-04 DIAGNOSIS — H33191 Other retinoschisis and retinal cysts, right eye: Secondary | ICD-10-CM | POA: Diagnosis not present

## 2015-07-04 DIAGNOSIS — H34832 Tributary (branch) retinal vein occlusion, left eye, with macular edema: Secondary | ICD-10-CM | POA: Diagnosis not present

## 2015-07-04 DIAGNOSIS — H33192 Other retinoschisis and retinal cysts, left eye: Secondary | ICD-10-CM | POA: Diagnosis not present

## 2015-07-04 DIAGNOSIS — H43812 Vitreous degeneration, left eye: Secondary | ICD-10-CM | POA: Diagnosis not present

## 2015-07-05 ENCOUNTER — Encounter (HOSPITAL_COMMUNITY): Payer: Medicare Other

## 2015-07-07 ENCOUNTER — Observation Stay (HOSPITAL_COMMUNITY)
Admission: EM | Admit: 2015-07-07 | Discharge: 2015-07-10 | Disposition: A | Discharge status: Home / Self Care | Payer: Medicare Other | Attending: Internal Medicine | Admitting: Internal Medicine

## 2015-07-07 ENCOUNTER — Other Ambulatory Visit: Payer: Self-pay

## 2015-07-07 ENCOUNTER — Emergency Department (HOSPITAL_COMMUNITY): Payer: Medicare Other

## 2015-07-07 ENCOUNTER — Encounter (HOSPITAL_COMMUNITY): Payer: Self-pay

## 2015-07-07 DIAGNOSIS — S81811A Laceration without foreign body, right lower leg, initial encounter: Secondary | ICD-10-CM | POA: Diagnosis not present

## 2015-07-07 DIAGNOSIS — W010XXA Fall on same level from slipping, tripping and stumbling without subsequent striking against object, initial encounter: Secondary | ICD-10-CM | POA: Diagnosis not present

## 2015-07-07 DIAGNOSIS — I959 Hypotension, unspecified: Secondary | ICD-10-CM | POA: Diagnosis not present

## 2015-07-07 DIAGNOSIS — W109XXA Fall (on) (from) unspecified stairs and steps, initial encounter: Secondary | ICD-10-CM | POA: Diagnosis not present

## 2015-07-07 DIAGNOSIS — Z87891 Personal history of nicotine dependence: Secondary | ICD-10-CM | POA: Diagnosis not present

## 2015-07-07 DIAGNOSIS — J449 Chronic obstructive pulmonary disease, unspecified: Secondary | ICD-10-CM | POA: Diagnosis not present

## 2015-07-07 DIAGNOSIS — W108XXA Fall (on) (from) other stairs and steps, initial encounter: Secondary | ICD-10-CM | POA: Diagnosis present

## 2015-07-07 DIAGNOSIS — S3993XA Unspecified injury of pelvis, initial encounter: Secondary | ICD-10-CM | POA: Diagnosis not present

## 2015-07-07 DIAGNOSIS — S80811A Abrasion, right lower leg, initial encounter: Secondary | ICD-10-CM | POA: Diagnosis not present

## 2015-07-07 DIAGNOSIS — T148 Other injury of unspecified body region: Secondary | ICD-10-CM | POA: Diagnosis not present

## 2015-07-07 DIAGNOSIS — S8012XA Contusion of left lower leg, initial encounter: Secondary | ICD-10-CM | POA: Diagnosis not present

## 2015-07-07 DIAGNOSIS — T1490XA Injury, unspecified, initial encounter: Secondary | ICD-10-CM

## 2015-07-07 DIAGNOSIS — S299XXA Unspecified injury of thorax, initial encounter: Secondary | ICD-10-CM | POA: Diagnosis not present

## 2015-07-07 DIAGNOSIS — I10 Essential (primary) hypertension: Secondary | ICD-10-CM | POA: Insufficient documentation

## 2015-07-07 DIAGNOSIS — T1490 Injury, unspecified: Secondary | ICD-10-CM | POA: Diagnosis present

## 2015-07-07 DIAGNOSIS — S8992XA Unspecified injury of left lower leg, initial encounter: Secondary | ICD-10-CM | POA: Diagnosis not present

## 2015-07-07 HISTORY — DX: Essential (primary) hypertension: I10

## 2015-07-07 MED ORDER — IPRATROPIUM-ALBUTEROL 0.5-2.5 (3) MG/3ML IN SOLN
3.0000 mL | RESPIRATORY_TRACT | Status: DC
  Administered 2015-07-07 – 2015-07-08 (×2): 3 mL via RESPIRATORY_TRACT
  Filled 2015-07-07 (×2): qty 3

## 2015-07-07 MED ORDER — TETANUS-DIPHTH-ACELL PERTUSSIS 5-2.5-18.5 LF-MCG/0.5 IM SUSP
0.5000 mL | Freq: Once | INTRAMUSCULAR | Status: AC
  Administered 2015-07-08: 0.5 mL via INTRAMUSCULAR
  Filled 2015-07-07: qty 0.5

## 2015-07-07 NOTE — ED Notes (Signed)
See trauma documentation 

## 2015-07-07 NOTE — H&P (Signed)
History   Meghan [name] is an 65 y.o. female.   Chief Complaint:  Chief Complaint  Patient presents with  . Trauma  pt with COPD tripped while walking up stairs. Denies loc. Hit RLE with significant skin flap/tear. "about 500cc blood loss" per ems. Pt was a little confused but was bradycardiac and mild hypoTN. Pt given atropine and HR improved. BP improved en route along with mentation. Denies cp/abd/back/neck pain. Denies chest pressure/jaw pain/arm pain prior to event. Was in usual state of health today. Does have COPD and has some SOB. Might have had some worse breathing today  Has HTN as well. Has fallen before with large skin tear to post calf. Denies blood thinner. Denies smoking. Denies neuropathy.   Trauma Mechanism of injury: fall Injury location: leg Injury location detail: R leg and L leg Incident location: home Arrived directly from scene: yes   Fall:      Fall occurred: tripped      Impact surface: stairs      Entrapped after fall: no      Suspicion of alcohol use: no      Suspicion of drug use: no  EMS/PTA data:      Ambulatory at scene: no      Blood loss: moderate      Responsiveness: alert      Oriented to: person, place and situation      Loss of consciousness: no      Amnesic to event: no      Airway interventions: none      Breathing interventions: oxygen      IV access: established      Medications administered: atropine      Circulation condition since incident: improving      Mental status condition since incident: improving  Current symptoms:      Associated symptoms:            Denies abdominal pain, back pain, chest pain, headache, loss of consciousness, nausea, neck pain, seizures and vomiting.   Relevant PMH:      Medical risk factors:            COPD.       Pharmacological risk factors:            No anticoagulation therapy or antiplatelet therapy.       Tetanus status: unknown   Past Medical History  Diagnosis Date  . COPD (chronic  obstructive pulmonary disease) ([place])   . HTN (hypertension)     No past surgical history on file.  History reviewed. No pertinent family history. Social History:  has no tobacco, alcohol, and drug history on file.  Allergies   Allergies  Allergen Reactions  . Fosamax [Alendronate Sodium] Other (See Comments)    "indegestion"     Home Medications   (Not in a hospital admission)  Trauma Course   Results for orders placed or performed during the hospital encounter of 07/07/15 (from the past 48 hour(s))  Prepare fresh frozen plasma     Status: None   Collection Time: 07/07/15 10:50 PM  Result Value Ref Range   Unit Number [id number]    Blood Component Type LIQ PLASMA    Unit division 00    Status of Unit REL FROM [facility]    Transfusion Status OK TO TRANSFUSE    Unit tag comment VERBAL ORDERS PER DR [facility]    Unit Number [id number]    Blood Component Type LIQ PLASMA    Unit  division 00    Status of Unit REL FROM [facility]    Transfusion Status OK TO TRANSFUSE    Unit tag comment VERBAL ORDERS PER DR MESNER   Type and screen     Status: None   Collection Time: 07/07/15 11:20 PM  Result Value Ref Range   ABO/RH(D) O POS    Antibody Screen NEG    Sample Expiration 07/10/2015    Unit Number [id number]    Blood Component Type RED CELLS,LR    Unit division 00    Status of Unit REL FROM [facility]    Unit tag comment VERBAL ORDERS PER DR MESNER    Transfusion Status OK TO TRANSFUSE    Crossmatch Result NOT NEEDED    Unit Number [id number]    Blood Component Type RBC CPDA1, LR    Unit division 00    Status of Unit REL FROM [facility]    Unit tag comment VERBAL ORDERS PER DR MESNER    Transfusion Status OK TO TRANSFUSE    Crossmatch Result NOT NEEDED   ABO/Rh     Status: None (Preliminary result)   Collection Time: 07/07/15 11:20 PM  Result Value Ref Range   ABO/RH(D) O POS   CDS serology     Status: None   Collection Time: 07/07/15 11:23 PM  Result Value Ref Range    CDS serology specimen      SPECIMEN WILL BE HELD FOR 14 DAYS IF TESTING IS REQUIRED  Comprehensive metabolic panel     Status: Abnormal   Collection Time: 07/07/15 11:23 PM  Result Value Ref Range   Sodium 138 135 - 145 mmol/L   Potassium 4.1 3.5 - 5.1 mmol/L   Chloride 105 101 - 111 mmol/L   CO2 20 (L) 22 - 32 mmol/L   Glucose, Bld 115 (H) 65 - 99 mg/dL   BUN 6 6 - 20 mg/dL   Creatinine, Ser 0.90 0.44 - 1.00 mg/dL   Calcium 9.1 8.9 - 10.3 mg/dL   Total Protein 6.3 (L) 6.5 - 8.1 g/dL   Albumin 3.6 3.5 - 5.0 g/dL   AST 21 15 - 41 U/L   ALT 20 14 - 54 U/L   Alkaline Phosphatase 58 38 - 126 U/L   Total Bilirubin 0.3 0.3 - 1.2 mg/dL   GFR calc non Af Amer >60 >60 mL/min   GFR calc Af Amer >60 >60 mL/min    Comment: (NOTE) The eGFR has been calculated using the CKD EPI equation. This calculation has not been validated in all clinical situations. eGFR's persistently <60 mL/min signify possible Chronic Kidney Disease.    Anion gap 13 5 - 15  CBC     Status: None   Collection Time: 07/07/15 11:23 PM  Result Value Ref Range   WBC 8.7 4.0 - 10.5 K/uL   RBC 4.58 3.87 - 5.11 MIL/uL   Hemoglobin 13.3 12.0 - 15.0 g/dL   HCT 41.1 36.0 - 46.0 %   MCV 89.7 78.0 - 100.0 fL   MCH 29.0 26.0 - 34.0 pg   MCHC 32.4 30.0 - 36.0 g/dL   RDW 13.4 11.5 - 15.5 %   Platelets 244 150 - 400 K/uL  Ethanol     Status: None   Collection Time: 07/07/15 11:23 PM  Result Value Ref Range   Alcohol, Ethyl (B) <5 <5 mg/dL    Comment:        LOWEST DETECTABLE LIMIT FOR SERUM ALCOHOL IS 5 mg/dL FOR MEDICAL PURPOSES  ONLY   Protime-INR     Status: None   Collection Time: 07/07/15 11:23 PM  Result Value Ref Range   Prothrombin Time 13.6 11.6 - 15.2 seconds   INR 1.02 0.00 - 1.49  Troponin I     Status: None   Collection Time: 07/07/15 11:23 PM  Result Value Ref Range   Troponin I <0.03 <0.031 ng/mL    Comment:        NO INDICATION OF MYOCARDIAL INJURY.   Sample to Blood Bank     Status: None    Collection Time: 07/07/15 11:26 PM  Result Value Ref Range   Blood Bank Specimen SAMPLE AVAILABLE FOR TESTING    Sample Expiration 07/10/2015   I-stat troponin, ED     Status: None   Collection Time: 07/07/15 11:50 PM  Result Value Ref Range   Troponin i, poc 0.00 0.00 - 0.08 ng/mL   Comment 3            Comment: Due to the release kinetics of cTnI, a negative result within the first hours of the onset of symptoms does not rule out myocardial infarction with certainty. If myocardial infarction is still suspected, repeat the test at appropriate intervals.    Dg Pelvis Portable  07/07/2015  CLINICAL DATA:  Trauma, fall downstairs. EXAM: PORTABLE PELVIS 1-2 VIEWS COMPARISON:  None. FINDINGS: The cortical margins of the bony pelvis are intact. No fracture. Pubic symphysis and sacroiliac joints are congruent. Both femoral heads are well-seated in the respective acetabula. IMPRESSION: No evidence of pelvic fracture. Electronically Signed   By: Jeb Levering M.D.   On: 07/07/2015 23:45   Dg Chest Portable 1 View  07/07/2015  CLINICAL DATA:  Trauma.  Fall downstairs. EXAM: PORTABLE CHEST 1 VIEW COMPARISON:  None. FINDINGS: Left lateral lower hemithorax excluded from the field of view. The lungs are hyperinflated with probable emphysematous change. Heart size and mediastinal contours are normal. There is no evidence pneumothorax, large pleural effusion or focal consolidation. No evidence of displaced rib fracture. Probable intra-articular bodies about the right shoulder. IMPRESSION: Hyperinflation and probable emphysema. No evidence of acute traumatic process. Left costophrenic angle excluded from field of view. Electronically Signed   By: Jeb Levering M.D.   On: 07/07/2015 23:44   Dg Tibia/fibula Left Port  07/07/2015  CLINICAL DATA:  Trauma, fall downstairs. EXAM: PORTABLE LEFT TIBIA AND FIBULA - 2 VIEW COMPARISON:  None. FINDINGS: Cortical margins of the tibia and fibula are intact. No evidence  of fracture. Knee and ankle alignment grossly maintained. There is anterior soft tissue edema. No radiopaque foreign body or soft tissue air. IMPRESSION: Anterior soft tissue edema.  No foreign body or fracture Electronically Signed   By: Jeb Levering M.D.   On: 07/07/2015 23:46   Dg Tibia/fibula Right Port  07/07/2015  CLINICAL DATA:  Trauma.  Fell downstairs. EXAM: PORTABLE RIGHT TIBIA AND FIBULA - 2 VIEW COMPARISON:  None. FINDINGS: Previous screw fixation of the proximal right tibia. There is soft tissue laceration along the anterior aspect of the lower leg. No underlying fracture or dislocation. IMPRESSION: 1. Anterior soft tissue laceration. 2. No fracture identified. Electronically Signed   By: Kerby Moors M.D.   On: 07/07/2015 23:46    Review of Systems  Constitutional: Negative for weight loss.  HENT: Negative for nosebleeds.   Eyes: Negative for blurred vision.  Respiratory: Positive for shortness of breath (chronic COPD).   Cardiovascular: Negative for chest pain, palpitations, orthopnea and PND.  Has some DOE chronically  Gastrointestinal: Negative for nausea, vomiting and abdominal pain.  Genitourinary: Negative for dysuria and hematuria.  Musculoskeletal: Negative.  Negative for back pain and neck pain.       B/l tib fib pain  Skin: Negative for itching and rash.  Neurological: Negative for dizziness, focal weakness, seizures, loss of consciousness and headaches.       Denies TIAs, amaurosis fugax  Endo/Heme/Allergies: Does not bruise/bleed easily.  Psychiatric/Behavioral: The patient is not nervous/anxious.     Blood pressure 152/74, pulse 85, temperature 98 F (36.7 C), temperature source Oral, resp. rate 18, SpO2 95 %. Physical Exam  Vitals reviewed. Constitutional: He is oriented to person, place, and time. He appears well-developed and well-nourished. No distress.  HENT:  Head: Normocephalic and atraumatic.  Right Ear: External ear normal.  Left Ear:  External ear normal.  Eyes: Conjunctivae and EOM are normal. Pupils are equal, round, and reactive to light. No scleral icterus.  Neck: Trachea normal and normal range of motion. Neck supple. No tracheal deviation present. No thyromegaly present.  Cardiovascular: Normal rate, regular rhythm, normal heart sounds and intact distal pulses.   Respiratory: Effort normal. No stridor. No respiratory distress. He has wheezes (some bilateral wheezes). He exhibits no tenderness.  GI: Soft. He exhibits no distension. There is no tenderness. There is no rebound.  Abdominal obesity  Musculoskeletal: He exhibits tenderness. He exhibits no edema.  Lymphadenopathy:    He has no cervical adenopathy.  Neurological: He is alert and oriented to person, place, and time. He exhibits normal muscle tone. GCS eye subscore is 4. GCS verbal subscore is 5. GCS motor subscore is 6.  MAE. Appropriate.   Skin: Skin is warm and dry. Abrasion and bruising noted. No rash noted. He is not diaphoretic. No erythema. No pallor.     Large complex RLE abrasion with significant amunt skin avulsion/flap (15 cm x 10cm); bruising on LLE. TTP on b/l tib/fib  Psychiatric: He has a normal mood and affect. His behavior is normal. Judgment and thought content normal.       Assessment/Plan Fall Complex superficial RLE skin laceration with skin lap LLE abrasion COPD HTN  Initial LE films show no gross fx F/u CXR Has complex superficial skin laceration to RLE; don't believe can suture skin back in place given amount of avulsion and lack of skin integrity. This will take some time to heal Update tetanus Check troponin  May need [facility] to see her to check her wheezing.    Leighton Ruff. Redmond Pulling, MD, FACS General, Bariatric, & Minimally Invasive Surgery [facility], [place]    [facility] M 07/08/2015, 3:21 AM   Procedures

## 2015-07-07 NOTE — ED Provider Notes (Signed)
CSN: [id number]     Arrival date & time 07/07/15  2306 History  By signing my name below, I, Freida Busman, attest that this documentation has been prepared under the direction and in the presence of Tomasita Crumble, MD . Electronically Signed: Freida Busman, Scribe. 07/08/2015. 12:02 AM.    Chief Complaint  Patient presents with  . Trauma   The history is provided by the patient and the EMS personnel. No language interpreter was used.    HPI Comments:  Meghan [name] is a 65 y.o. female brought in by ambulance, who presents to the Emergency Department s/p fall PTA complaining of a large wound to her RLE and BLE pain following the incident. EMS states pt slipped while going up the stairs. Pt denies CP and dizziness prior to fall. She notes increased SOB today secondary to COPD. EMS also report bradycardia and hypotension en route. Pt was also given a breathing tx and atropine. Pt denies use of blood thinners. Tetanus status is unknown.  History reviewed. No pertinent past medical history. No past surgical history on file. History reviewed. No pertinent family history. Social History  Substance Use Topics  . Smoking status: None  . Smokeless tobacco: None  . Alcohol Use: None   OB History    No data available     Review of Systems  10 systems reviewed and all are negative for acute change except as noted in the HPI.  Allergies  Fosamax  Home Medications   Prior to Admission medications   Medication Sig Start Date End Date Taking? Authorizing Provider  albuterol (PROVENTIL HFA;VENTOLIN HFA) 108 (90 Base) MCG/ACT inhaler Inhale 2 puffs into the lungs every 6 (six) hours as needed for wheezing or shortness of breath.   Yes Historical Provider, MD  budesonide-formoterol (SYMBICORT) 160-4.5 MCG/ACT inhaler Inhale 2 puffs into the lungs 2 (two) times daily.   Yes Historical Provider, MD  Cholecalciferol (VITAMIN D PO) Take 1 tablet by mouth daily.   Yes Historical Provider, MD  losartan  (COZAAR) 25 MG tablet Take 25 mg by mouth daily.   Yes Historical Provider, MD   BP 168/86 mmHg  Pulse 107  Temp(Src) 98 F (36.7 C) (Oral)  Resp 22  SpO2 99% Physical Exam  Constitutional: She is oriented to person, place, and time. She appears well-developed and well-nourished. No distress.  HENT:  Head: Normocephalic and atraumatic.  Nose: Nose normal.  Mouth/Throat: Oropharynx is clear and moist. No oropharyngeal exudate.  Eyes: Conjunctivae and EOM are normal. Pupils are equal, round, and reactive to light. No scleral icterus.  Neck: Normal range of motion. Neck supple. No JVD present. No tracheal deviation present. No thyromegaly present.  Cardiovascular: Normal rate, regular rhythm and normal heart sounds.  Exam reveals no gallop and no friction rub.   No murmur heard. Pulses:      Femoral pulses are 2+ on the right side, and 2+ on the left side.      Dorsalis pedis pulses are 2+ on the right side, and 2+ on the left side.  Pulmonary/Chest: Effort normal. No respiratory distress. She has wheezes. She exhibits no tenderness.  Abdominal: Soft. Bowel sounds are normal. She exhibits no distension and no mass. There is no tenderness. There is no rebound and no guarding.  Musculoskeletal:  Left medial tibia soft tissue hematoma   Lymphadenopathy:    She has no cervical adenopathy.  Neurological: She is alert and oriented to person, place, and time. No cranial nerve deficit.  She exhibits normal muscle tone.  Skin: Skin is warm and dry. No rash noted.  Right distal tibia large 7 cm laceration to the epidermis with exposure of the soft tissue No bony exposure nml pulses and senstation distally  Nursing note and vitals reviewed.   ED Course  Procedures   DIAGNOSTIC STUDIES:  Oxygen Saturation is 99% on RA, normal by my interpretation.    COORDINATION OF CARE:  11:14 PM Discussed treatment plan with pt at bedside and pt agreed to plan.  Labs Review Labs Reviewed  CDS  SEROLOGY  COMPREHENSIVE METABOLIC PANEL  CBC  ETHANOL  PROTIME-INR  TROPONIN I  TROPONIN I  SAMPLE TO BLOOD BANK  TYPE AND SCREEN    Imaging Review Dg Pelvis Portable  07/07/2015  CLINICAL DATA:  Trauma, fall downstairs. EXAM: PORTABLE PELVIS 1-2 VIEWS COMPARISON:  None. FINDINGS: The cortical margins of the bony pelvis are intact. No fracture. Pubic symphysis and sacroiliac joints are congruent. Both femoral heads are well-seated in the respective acetabula. IMPRESSION: No evidence of pelvic fracture. Electronically Signed   By: Rubye OaksMelanie  Ehinger M.D.   On: 07/07/2015 23:45   Dg Chest Portable 1 View  07/07/2015  CLINICAL DATA:  Trauma.  Fall downstairs. EXAM: PORTABLE CHEST 1 VIEW COMPARISON:  None. FINDINGS: Left lateral lower hemithorax excluded from the field of view. The lungs are hyperinflated with probable emphysematous change. Heart size and mediastinal contours are normal. There is no evidence pneumothorax, large pleural effusion or focal consolidation. No evidence of displaced rib fracture. Probable intra-articular bodies about the right shoulder. IMPRESSION: Hyperinflation and probable emphysema. No evidence of acute traumatic process. Left costophrenic angle excluded from field of view. Electronically Signed   By: Rubye OaksMelanie  Ehinger M.D.   On: 07/07/2015 23:44   Dg Tibia/fibula Left Port  07/07/2015  CLINICAL DATA:  Trauma, fall downstairs. EXAM: PORTABLE LEFT TIBIA AND FIBULA - 2 VIEW COMPARISON:  None. FINDINGS: Cortical margins of the tibia and fibula are intact. No evidence of fracture. Knee and ankle alignment grossly maintained. There is anterior soft tissue edema. No radiopaque foreign body or soft tissue air. IMPRESSION: Anterior soft tissue edema.  No foreign body or fracture Electronically Signed   By: Rubye OaksMelanie  Ehinger M.D.   On: 07/07/2015 23:46   Dg Tibia/fibula Right Port  07/07/2015  CLINICAL DATA:  Trauma.  Fell downstairs. EXAM: PORTABLE RIGHT TIBIA AND FIBULA - 2 VIEW  COMPARISON:  None. FINDINGS: Previous screw fixation of the proximal right tibia. There is soft tissue laceration along the anterior aspect of the lower leg. No underlying fracture or dislocation. IMPRESSION: 1. Anterior soft tissue laceration. 2. No fracture identified. Electronically Signed   By: Signa Kellaylor  Stroud M.D.   On: 07/07/2015 23:46   I have personally reviewed and evaluated these images and lab results as part of my medical decision-making.   EKG Interpretation None      MDM   Final diagnoses:  Trauma  Trauma  Trauma    Patient presents to the ED for a fall.  Level 1 trauma was called pre hospital.  There is a large laceration as described  Above.  Tetanus vaccine was given.  Dr. Andrey CampanileWilson evaluated the patient and recs for admission for observation and wound care.  There is no sturdy skin to repair the laceration with.  Will clean out the wound, cover the skin over, then use an ABD pad and kerlex.  Patient given duoneb for wheezing.  Will perform work up for other possible  causes of the fall.  Dr. Randie Heinz for medical admission for wheezing, SOB, wound care, and repeat surgery eval in the morning.   I personally performed the services described in this documentation, which was scribed in my presence. The recorded information has been reviewed and is accurate.       Tomasita Crumble, MD 07/08/15 [phone]

## 2015-07-08 ENCOUNTER — Encounter (HOSPITAL_COMMUNITY): Payer: Self-pay | Admitting: Internal Medicine

## 2015-07-08 DIAGNOSIS — W108XXA Fall (on) (from) other stairs and steps, initial encounter: Secondary | ICD-10-CM | POA: Diagnosis not present

## 2015-07-08 DIAGNOSIS — S80811A Abrasion, right lower leg, initial encounter: Secondary | ICD-10-CM

## 2015-07-08 DIAGNOSIS — S81811A Laceration without foreign body, right lower leg, initial encounter: Secondary | ICD-10-CM | POA: Diagnosis not present

## 2015-07-08 LAB — COMPREHENSIVE METABOLIC PANEL
ALK PHOS: 58 U/L (ref 38–126)
ALT: 20 U/L (ref 14–54)
AST: 21 U/L (ref 15–41)
Albumin: 3.6 g/dL (ref 3.5–5.0)
Anion gap: 13 (ref 5–15)
BUN: 6 mg/dL (ref 6–20)
CHLORIDE: 105 mmol/L (ref 101–111)
CO2: 20 mmol/L — AB (ref 22–32)
CREATININE: 0.9 mg/dL (ref 0.44–1.00)
Calcium: 9.1 mg/dL (ref 8.9–10.3)
GFR calc Af Amer: >60 mL/min (ref >60)
Glucose, Bld: 115 mg/dL — ABNORMAL HIGH (ref 65–99)
Potassium: 4.1 mmol/L (ref 3.5–5.1)
Sodium: 138 mmol/L (ref 135–145)
Total Bilirubin: 0.3 mg/dL (ref 0.3–1.2)
Total Protein: 6.3 g/dL — ABNORMAL LOW (ref 6.5–8.1)

## 2015-07-08 LAB — TYPE AND SCREEN
ABO/RH(D): O POS
ANTIBODY SCREEN: NEGATIVE
UNIT DIVISION: 0
Unit division: 0

## 2015-07-08 LAB — CBC
HCT: 41.1 % (ref 36.0–46.0)
Hemoglobin: 13.3 g/dL (ref 12.0–15.0)
MCH: 29 pg (ref 26.0–34.0)
MCHC: 32.4 g/dL (ref 30.0–36.0)
MCV: 89.7 fL (ref 78.0–100.0)
PLATELETS: 244 10*3/uL (ref 150–400)
RBC: 4.58 MIL/uL (ref 3.87–5.11)
RDW: 13.4 % (ref 11.5–15.5)
WBC: 8.7 10*3/uL (ref 4.0–10.5)

## 2015-07-08 LAB — I-STAT TROPONIN, ED: Troponin i, poc: 0 ng/mL (ref 0.00–0.08)

## 2015-07-08 LAB — CDS SEROLOGY

## 2015-07-08 LAB — PREPARE FRESH FROZEN PLASMA
UNIT DIVISION: 0
Unit division: 0

## 2015-07-08 LAB — ETHANOL: Alcohol, Ethyl (B): <5 mg/dL (ref <5)

## 2015-07-08 LAB — TROPONIN I: Troponin I: <0.03 ng/mL (ref <0.031)

## 2015-07-08 LAB — BLOOD PRODUCT ORDER (VERBAL) VERIFICATION

## 2015-07-08 LAB — ABO/RH: ABO/RH(D): O POS

## 2015-07-08 LAB — PROTIME-INR
INR: 1.02 (ref 0.00–1.49)
PROTHROMBIN TIME: 13.6 s (ref 11.6–15.2)

## 2015-07-08 MED ORDER — MOMETASONE FURO-FORMOTEROL FUM 200-5 MCG/ACT IN AERO
2.0000 | INHALATION_SPRAY | Freq: Two times a day (BID) | RESPIRATORY_TRACT | Status: DC
  Administered 2015-07-08 – 2015-07-10 (×6): 2 via RESPIRATORY_TRACT
  Filled 2015-07-08: qty 8.8

## 2015-07-08 MED ORDER — NYSTATIN 100000 UNIT/ML MT SUSP
5.0000 mL | Freq: Four times a day (QID) | OROMUCOSAL | Status: DC
  Administered 2015-07-08 – 2015-07-10 (×10): 500000 [IU] via ORAL
  Filled 2015-07-08 (×10): qty 5

## 2015-07-08 MED ORDER — ALBUTEROL SULFATE HFA 108 (90 BASE) MCG/ACT IN AERS: 2.0000 | INHALATION_SPRAY | Freq: Four times a day (QID) | RESPIRATORY_TRACT | Status: DC | PRN

## 2015-07-08 MED ORDER — IPRATROPIUM-ALBUTEROL 0.5-2.5 (3) MG/3ML IN SOLN
3.0000 mL | Freq: Three times a day (TID) | RESPIRATORY_TRACT | Status: DC
  Administered 2015-07-09: 3 mL via RESPIRATORY_TRACT
  Filled 2015-07-08 (×3): qty 3

## 2015-07-08 MED ORDER — ALBUTEROL SULFATE (2.5 MG/3ML) 0.083% IN NEBU
2.5000 mg | INHALATION_SOLUTION | Freq: Four times a day (QID) | RESPIRATORY_TRACT | Status: DC | PRN
  Administered 2015-07-08: 2.5 mg via RESPIRATORY_TRACT
  Filled 2015-07-08: qty 3

## 2015-07-08 MED ORDER — LOSARTAN POTASSIUM 25 MG PO TABS
25.0000 mg | ORAL_TABLET | Freq: Every day | ORAL | Status: DC
  Administered 2015-07-08 – 2015-07-10 (×3): 25 mg via ORAL
  Filled 2015-07-08 (×3): qty 1

## 2015-07-08 NOTE — H&P (Signed)
[facility] History and Physical  Meghan [name] ZOX:[id number] DOB: May 21, 1950 DOA: 07/07/2015  Referring physician: EDP PCP: No primary care provider on file.   Chief Complaint: Trauma   HPI: Meghan [name] is a 65 y.o. female with h/o COPD, patient tripped while walking down stairs today.  She suffered a large abrasion to her RLE.  See physical exam.  Review of Systems: Systems reviewed.  As above, otherwise negative  Past Medical History  Diagnosis Date  . COPD (chronic obstructive pulmonary disease) (HCC)   . HTN (hypertension)    No past surgical history on file. Social History:  has no tobacco, alcohol, and drug history on file.  Allergies  Allergen Reactions  . Fosamax [Alendronate Sodium] Other (See Comments)    "indegestion"     History reviewed. No pertinent family history.   Prior to Admission medications   Medication Sig Start Date End Date Taking? Authorizing Provider  albuterol (PROVENTIL HFA;VENTOLIN HFA) 108 (90 Base) MCG/ACT inhaler Inhale 2 puffs into the lungs every 6 (six) hours as needed for wheezing or shortness of breath.   Yes Historical Provider, MD  budesonide-formoterol (SYMBICORT) 160-4.5 MCG/ACT inhaler Inhale 2 puffs into the lungs 2 (two) times daily.   Yes Historical Provider, MD  Cholecalciferol (VITAMIN D PO) Take 1 tablet by mouth daily.   Yes Historical Provider, MD  losartan (COZAAR) 25 MG tablet Take 25 mg by mouth daily.   Yes Historical Provider, MD  nystatin (MYCOSTATIN) 100000 UNIT/ML suspension Take 5 mLs by mouth 4 (four) times daily.   Yes Historical Provider, MD   Physical Exam: Filed Vitals:   07/08/15 0015 07/08/15 0030  BP: 164/84 159/60  Pulse: 116 94  Temp:    Resp: 24 20    BP 159/60 mmHg  Pulse 94  Temp(Src) 98 F (36.7 C) (Oral)  Resp 20  SpO2 93%  General Appearance:    Alert, oriented, no distress, appears stated age  Head:    Normocephalic, atraumatic  Eyes:    PERRL, EOMI, sclera non-icteric         Nose:   Nares without drainage or epistaxis. Mucosa, turbinates normal  Throat:   Moist mucous membranes. Oropharynx without erythema or exudate.  Neck:   Supple. No carotid bruits.  No thyromegaly.  No lymphadenopathy.   Back:     No CVA tenderness, no spinal tenderness  Lungs:     Clear to auscultation bilaterally, without wheezes, rhonchi or rales  Chest wall:    No tenderness to palpitation  Heart:    Regular rate and rhythm without murmurs, gallops, rubs  Abdomen:     Soft, non-tender, nondistended, normal bowel sounds, no organomegaly  Genitalia:    deferred  Rectal:    deferred  Extremities:   No clubbing, cyanosis or edema.  Pulses:   2+ and symmetric all extremities  Skin:     Lymph nodes:   Cervical, supraclavicular, and axillary nodes normal  Neurologic:   CNII-XII intact. Normal strength, sensation and reflexes      throughout    Labs on Admission:  Basic Metabolic Panel:  Recent Labs Lab 07/07/15 2323  NA 138  K 4.1  CL 105  CO2 20*  GLUCOSE 115*  BUN 6  CREATININE 0.90  CALCIUM 9.1   Liver Function Tests:  Recent Labs Lab 07/07/15 2323  AST 21  ALT 20  ALKPHOS 58  BILITOT 0.3  PROT 6.3*  ALBUMIN 3.6   No results for input(s): LIPASE,  AMYLASE in the last 168 hours. No results for input(s): AMMONIA in the last 168 hours. CBC:  Recent Labs Lab 07/07/15 2323  WBC 8.7  HGB 13.3  HCT 41.1  MCV 89.7  PLT 244   Cardiac Enzymes:  Recent Labs Lab 07/07/15 2323  TROPONINI <0.03    BNP (last 3 results) No results for input(s): PROBNP in the last 8760 hours. CBG: No results for input(s): GLUCAP in the last 168 hours.  Radiological Exams on Admission: Dg Pelvis Portable  07/07/2015  CLINICAL DATA:  Trauma, fall downstairs. EXAM: PORTABLE PELVIS 1-2 VIEWS COMPARISON:  None. FINDINGS: The cortical margins of the bony pelvis are intact. No fracture. Pubic symphysis and sacroiliac joints are congruent. Both femoral heads are well-seated in  the respective acetabula. IMPRESSION: No evidence of pelvic fracture. Electronically Signed   By: Rubye OaksMelanie  Ehinger M.D.   On: 07/07/2015 23:45   Dg Chest Portable 1 View  07/07/2015  CLINICAL DATA:  Trauma.  Fall downstairs. EXAM: PORTABLE CHEST 1 VIEW COMPARISON:  None. FINDINGS: Left lateral lower hemithorax excluded from the field of view. The lungs are hyperinflated with probable emphysematous change. Heart size and mediastinal contours are normal. There is no evidence pneumothorax, large pleural effusion or focal consolidation. No evidence of displaced rib fracture. Probable intra-articular bodies about the right shoulder. IMPRESSION: Hyperinflation and probable emphysema. No evidence of acute traumatic process. Left costophrenic angle excluded from field of view. Electronically Signed   By: Rubye OaksMelanie  Ehinger M.D.   On: 07/07/2015 23:44   Dg Tibia/fibula Left Port  07/07/2015  CLINICAL DATA:  Trauma, fall downstairs. EXAM: PORTABLE LEFT TIBIA AND FIBULA - 2 VIEW COMPARISON:  None. FINDINGS: Cortical margins of the tibia and fibula are intact. No evidence of fracture. Knee and ankle alignment grossly maintained. There is anterior soft tissue edema. No radiopaque foreign body or soft tissue air. IMPRESSION: Anterior soft tissue edema.  No foreign body or fracture Electronically Signed   By: Rubye OaksMelanie  Ehinger M.D.   On: 07/07/2015 23:46   Dg Tibia/fibula Right Port  07/07/2015  CLINICAL DATA:  Trauma.  Fell downstairs. EXAM: PORTABLE RIGHT TIBIA AND FIBULA - 2 VIEW COMPARISON:  None. FINDINGS: Previous screw fixation of the proximal right tibia. There is soft tissue laceration along the anterior aspect of the lower leg. No underlying fracture or dislocation. IMPRESSION: 1. Anterior soft tissue laceration. 2. No fracture identified. Electronically Signed   By: Signa Kellaylor  Stroud M.D.   On: 07/07/2015 23:46    EKG: Independently reviewed.  Assessment/Plan Principal Problem:   Fall (on) (from) other stairs and  steps, initial encounter Active Problems:   Abrasion of right lower extremity   1. RLE abrasion - 1. Trauma requests that we admit 2. Wound care consult in AM 3. See their note for more details. 2. HTN - continue home meds    Code Status: Full  Family Communication: Husband at bedside Disposition Plan: Admit to obs   Time spent: 30 min  GARDNER, JARED M. [facility] Pager [phone]  If 7AM-7PM, please contact the day team taking care of the patient [email or web address] Password [email or web address] 07/08/2015, 1:52 AM

## 2015-07-08 NOTE — Progress Notes (Addendum)
Patient seen and examined, denies pain, no sob, on room air, lung exam, does has prolonged expiratory phase, no wheezing, no rales, no rhonchi.   Leg wound , DVT prophylaxis and activity level per general surgery.

## 2015-07-08 NOTE — Progress Notes (Signed)
Patient ID: Meghan [name], female   DOB: [birth year], 65 y.o.   MRN: [id number]    Subjective: Does not want to use bedpan - asking to get UOB  Objective: Vital signs in last 24 hours: Temp:  [97.9 F (36.6 C)-98 F (36.7 C)] 97.9 F (36.6 C) (04/09 0411) Pulse Rate:  [79-116] 79 (04/09 0411) Resp:  [15-24] 18 (04/09 0411) BP: (128-168)/(54-87) 150/67 mmHg (04/09 0411) SpO2:  [92 %-99 %] 93 % (04/09 0411) Weight:  [83.598 kg (184 lb 4.8 oz)] 83.598 kg (184 lb 4.8 oz) (04/09 0449) Last BM Date: 07/07/15  Intake/Output from previous day: 04/08 0701 - 04/09 0700 In: 700 [I.V.:700] Out: 250 [Urine:250] Intake/Output this shift:    General appearance: cooperative Extremities: R shin wound dressed - see our note from overnight  Lab Results: CBC   Recent Labs  07/07/15 2323  WBC 8.7  HGB 13.3  HCT 41.1  PLT 244   BMET  Recent Labs  07/07/15 2323  NA 138  K 4.1  CL 105  CO2 20*  GLUCOSE 115*  BUN 6  CREATININE 0.90  CALCIUM 9.1   Anti-infectives: Anti-infectives    None      Assessment/Plan: R shin laceration/skin tears - very thin skin will likely dry and we will debride (bedside). Wound nursing to evaluate. We will also consider plastics to see for possible a-cell. I discussed the plan with her. OK to ambulate with assist.      Violeta GelinasBurke Clive Parcel, MD, MPH, FACS Trauma: [phone] General Surgery: [phone]  07/08/2015

## 2015-07-09 ENCOUNTER — Encounter (HOSPITAL_COMMUNITY): Payer: Self-pay | Admitting: Plastic Surgery

## 2015-07-09 DIAGNOSIS — S81811A Laceration without foreign body, right lower leg, initial encounter: Secondary | ICD-10-CM | POA: Diagnosis not present

## 2015-07-09 DIAGNOSIS — J449 Chronic obstructive pulmonary disease, unspecified: Secondary | ICD-10-CM | POA: Diagnosis not present

## 2015-07-09 DIAGNOSIS — I10 Essential (primary) hypertension: Secondary | ICD-10-CM | POA: Diagnosis not present

## 2015-07-09 DIAGNOSIS — W108XXA Fall (on) (from) other stairs and steps, initial encounter: Secondary | ICD-10-CM | POA: Diagnosis not present

## 2015-07-09 DIAGNOSIS — S80811D Abrasion, right lower leg, subsequent encounter: Secondary | ICD-10-CM | POA: Diagnosis not present

## 2015-07-09 DIAGNOSIS — S80811A Abrasion, right lower leg, initial encounter: Secondary | ICD-10-CM | POA: Diagnosis not present

## 2015-07-09 LAB — PREALBUMIN: PREALBUMIN: 15.4 mg/dL — AB (ref 18–38)

## 2015-07-09 MED ORDER — ZINC SULFATE 220 (50 ZN) MG PO CAPS
220.0000 mg | ORAL_CAPSULE | Freq: Every day | ORAL | Status: DC
  Administered 2015-07-09 – 2015-07-10 (×2): 220 mg via ORAL
  Filled 2015-07-09 (×2): qty 1

## 2015-07-09 MED ORDER — IPRATROPIUM-ALBUTEROL 0.5-2.5 (3) MG/3ML IN SOLN
3.0000 mL | Freq: Two times a day (BID) | RESPIRATORY_TRACT | Status: DC
Start: 2015-07-09 — End: 2015-07-10
  Administered 2015-07-09 – 2015-07-10 (×3): 3 mL via RESPIRATORY_TRACT
  Filled 2015-07-09 (×3): qty 3

## 2015-07-09 MED ORDER — MONTELUKAST SODIUM 10 MG PO TABS
10.0000 mg | ORAL_TABLET | Freq: Every morning | ORAL | Status: DC
  Administered 2015-07-09: 10 mg via ORAL
  Filled 2015-07-09 (×2): qty 1

## 2015-07-09 MED ORDER — ALBUTEROL SULFATE (2.5 MG/3ML) 0.083% IN NEBU
2.5000 mg | INHALATION_SOLUTION | RESPIRATORY_TRACT | Status: DC | PRN
  Administered 2015-07-10: 2.5 mg via RESPIRATORY_TRACT
  Filled 2015-07-09: qty 3

## 2015-07-09 MED ORDER — VITAMIN C 500 MG/5ML PO SYRP
500.0000 mg | ORAL_SOLUTION | Freq: Every day | ORAL | Status: DC
  Administered 2015-07-09 – 2015-07-10 (×2): 500 mg via ORAL
  Filled 2015-07-09 (×2): qty 5

## 2015-07-09 MED ORDER — ADULT MULTIVITAMIN W/MINERALS CH
1.0000 | ORAL_TABLET | Freq: Every day | ORAL | Status: DC
  Administered 2015-07-09 – 2015-07-10 (×2): 1 via ORAL
  Filled 2015-07-09 (×2): qty 1

## 2015-07-09 NOTE — Care Management Note (Signed)
Case Management Note  Patient Details  Name: Meghan [name] MRN: [id number] Date of Birth: [birth year]  Subjective/Objective:                 Spoke with patient at the bedside, MOON letter reviewed. Patient in obs for wound to shin s/p fall at home. Patient states she may receive surgery (debridement and VAC placement) prior to discharge.    Action/Plan:  [facility]  Expected Discharge Date:                  Expected Discharge Plan:  Home w Home Health Services  In-House Referral:     Discharge planning Services  CM Consult  Post Acute Care Choice:    Choice offered to:     DME Arranged:    DME Agency:     HH Arranged:    HH Agency:     Status of Service:  In process, will continue to follow  Medicare Important Message Given:    Date Medicare IM Given:    Medicare IM give by:    Date Additional Medicare IM Given:    Additional Medicare Important Message give by:     If discussed at Long Length of Stay Meetings, dates discussed:    Additional Comments:  Lawerance SabalDebbie Daisee Centner, RN 07/09/2015, 11:09 AM

## 2015-07-09 NOTE — Care Management Obs Status (Signed)
MEDICARE OBSERVATION STATUS NOTIFICATION   Patient Details  Name: Meghan [name] MRN: [id number] Date of Birth: 03-Oct-1950   Medicare Observation Status Notification Given:  Yes    Lawerance SabalDebbie Bonner Larue, RN 07/09/2015, 11:08 AM

## 2015-07-09 NOTE — Consult Note (Signed)
WOC wound consult note Reason for Consult: skin tear RLE Trauma consulted as well, arrived at the bedside at the same time.  WOC and trauma to evaluate together  Wound type:traumatic skin injury with large skin flap that can not be re-approximated, exposed subcutaneous tissue Pressure Ulcer POA: No Measurement:aprox 25cm x 6cm x 0.3cm  Wound bed: clean, bloody subcutaneous tissue. Dark skin flap Drainage (amount, consistency, odor) moderate to heavy serosanguinous  Periwound: intact  Dressing procedure/placement/frequency: Plans for possible OR for debridement of skin flap and use of NPWT per trauma and/or evaluation of wound by plastic surgery.  WOC applied non adherent dressing today with ABD pad and kerlix.  Will ask staff to leave intact until decisions made about surgery.  WOC will follow along with you for assistance with wound care as needed.  Tamma Brigandi [place] RN,CWOCN [phone]

## 2015-07-09 NOTE — Progress Notes (Signed)
Patient ID: Meghan BullsLanice B [name], female   DOB: 03-19-1951, 65 y.o.   MRN: [id number]  Subjective: No new c/o. Wound is tender but she hasn't needed pain medication.   Objective: Vital signs in last 24 hours: Temp:  [98.2 F (36.8 C)-99.6 F (37.6 C)] 99.6 F (37.6 C) (04/10 0420) Pulse Rate:  [89-108] 95 (04/10 0420) Resp:  [19-20] 20 (04/09 2125) BP: (122-155)/(62-75) 127/75 mmHg (04/10 0420) SpO2:  [92 %-96 %] 92 % (04/10 0420) Last BM Date: 07/07/15   Physical Exam Extremities: RLE wound with overlying dermis contused vs necrotic. Good granulation tissue in wound bed.   Assessment/Plan: R shin laceration/skin tears - I would favor either debridement of skin in OR and placement of VAC or plastic surgery evaluation. Will d/w MD.    Freeman CaldronMichael J. Myeasha Ballowe, PA-C Pager: [phone] General Trauma PA Pager: [phone]  07/09/2015

## 2015-07-09 NOTE — Consult Note (Signed)
Reason for Consult:right leg wound Referring Physician: Dr. Georganna Skeans  Meghan [name] is an 65 y.o. female.  HPI: The patient is a 65 yrs old wf here for treatment after a fall down her steps at home.  She has severe COPD and on steroids for treatment.  She had a right knee surgery in the past which makes it difficult for her to bend her knee.  She thinks this is why she trips frequently.  She tripped while trying to go up the steps.  She fell and degloved her anterior right leg area.  She has a large wound that includes the majority of the right leg to fat.  The superficial skin is blue and necrotic.  The underlying fat looks ok at this time.  Full exam limited by pain.  Pulses in the leg are regular.  She has multiple skin bruising likely secondary to the fall but also the steroid side effect.  Vaseline dressing being placed at this time.  Past Medical History  Diagnosis Date  . COPD (chronic obstructive pulmonary disease) ([place])   . HTN (hypertension)     History reviewed. No pertinent past surgical history.  History reviewed. No pertinent family history.  Social History:  reports that she has quit smoking. She does not have any smokeless tobacco history on file. Her alcohol and drug histories are not on file.  Allergies:  Allergies  Allergen Reactions  . Fosamax [Alendronate Sodium] Other (See Comments)    "indegestion"     Medications: I have reviewed the patient's current medications.  Results for orders placed or performed during the hospital encounter of 07/07/15 (from the past 48 hour(s))  Prepare fresh frozen plasma     Status: None   Collection Time: 07/07/15 10:50 PM  Result Value Ref Range   Unit Number [id number]    Blood Component Type LIQ PLASMA    Unit division 00    Status of Unit REL FROM [facility]    Transfusion Status OK TO TRANSFUSE    Unit tag comment VERBAL ORDERS PER DR [place]    Unit Number [id number]    Blood Component Type LIQ PLASMA    Unit  division 00    Status of Unit REL FROM [facility]    Transfusion Status OK TO TRANSFUSE    Unit tag comment VERBAL ORDERS PER DR MESNER   Type and screen     Status: None   Collection Time: 07/07/15 11:20 PM  Result Value Ref Range   ABO/RH(D) O POS    Antibody Screen NEG    Sample Expiration 07/10/2015    Unit Number [id number]    Blood Component Type RED CELLS,LR    Unit division 00    Status of Unit REL FROM [facility]    Unit tag comment VERBAL ORDERS PER DR MESNER    Transfusion Status OK TO TRANSFUSE    Crossmatch Result NOT NEEDED    Unit Number [id number]    Blood Component Type RBC CPDA1, LR    Unit division 00    Status of Unit REL FROM [facility]    Unit tag comment VERBAL ORDERS PER DR MESNER    Transfusion Status OK TO TRANSFUSE    Crossmatch Result NOT NEEDED   ABO/Rh     Status: None   Collection Time: 07/07/15 11:20 PM  Result Value Ref Range   ABO/RH(D) O POS   CDS serology     Status: None   Collection Time: 07/07/15 11:23 PM  Result Value Ref Range   CDS serology specimen      SPECIMEN WILL BE HELD FOR 14 DAYS IF TESTING IS REQUIRED  Comprehensive metabolic panel     Status: Abnormal   Collection Time: 07/07/15 11:23 PM  Result Value Ref Range   Sodium 138 135 - 145 mmol/L   Potassium 4.1 3.5 - 5.1 mmol/L   Chloride 105 101 - 111 mmol/L   CO2 20 (L) 22 - 32 mmol/L   Glucose, Bld 115 (H) 65 - 99 mg/dL   BUN 6 6 - 20 mg/dL   Creatinine, Ser 0.90 0.44 - 1.00 mg/dL   Calcium 9.1 8.9 - 10.3 mg/dL   Total Protein 6.3 (L) 6.5 - 8.1 g/dL   Albumin 3.6 3.5 - 5.0 g/dL   AST 21 15 - 41 U/L   ALT 20 14 - 54 U/L   Alkaline Phosphatase 58 38 - 126 U/L   Total Bilirubin 0.3 0.3 - 1.2 mg/dL   GFR calc non Af Amer >60 >60 mL/min   GFR calc Af Amer >60 >60 mL/min    Comment: (NOTE) The eGFR has been calculated using the CKD EPI equation. This calculation has not been validated in all clinical situations. eGFR's persistently <60 mL/min signify possible Chronic  Kidney Disease.    Anion gap 13 5 - 15  CBC     Status: None   Collection Time: 07/07/15 11:23 PM  Result Value Ref Range   WBC 8.7 4.0 - 10.5 K/uL   RBC 4.58 3.87 - 5.11 MIL/uL   Hemoglobin 13.3 12.0 - 15.0 g/dL   HCT 41.1 36.0 - 46.0 %   MCV 89.7 78.0 - 100.0 fL   MCH 29.0 26.0 - 34.0 pg   MCHC 32.4 30.0 - 36.0 g/dL   RDW 13.4 11.5 - 15.5 %   Platelets 244 150 - 400 K/uL  Ethanol     Status: None   Collection Time: 07/07/15 11:23 PM  Result Value Ref Range   Alcohol, Ethyl (B) <5 <5 mg/dL    Comment:        LOWEST DETECTABLE LIMIT FOR SERUM ALCOHOL IS 5 mg/dL FOR MEDICAL PURPOSES ONLY   Protime-INR     Status: None   Collection Time: 07/07/15 11:23 PM  Result Value Ref Range   Prothrombin Time 13.6 11.6 - 15.2 seconds   INR 1.02 0.00 - 1.49  Troponin I     Status: None   Collection Time: 07/07/15 11:23 PM  Result Value Ref Range   Troponin I <0.03 <0.031 ng/mL    Comment:        NO INDICATION OF MYOCARDIAL INJURY.   I-stat troponin, ED     Status: None   Collection Time: 07/07/15 11:50 PM  Result Value Ref Range   Troponin i, poc 0.00 0.00 - 0.08 ng/mL   Comment 3            Comment: Due to the release kinetics of cTnI, a negative result within the first hours of the onset of symptoms does not rule out myocardial infarction with certainty. If myocardial infarction is still suspected, repeat the test at appropriate intervals.   Provider-confirm verbal Blood Bank order - Type & Screen, RBC, FFP; 4 Units; Order taken: 07/07/2015; 10:50 PM; Level 1 Trauma, Emergency Release     Status: None   Collection Time: 07/08/15  3:30 PM  Result Value Ref Range   Blood product order confirm MD AUTHORIZATION REQUESTED  Dg Pelvis Portable  07/07/2015  CLINICAL DATA:  Trauma, fall downstairs. EXAM: PORTABLE PELVIS 1-2 VIEWS COMPARISON:  None. FINDINGS: The cortical margins of the bony pelvis are intact. No fracture. Pubic symphysis and sacroiliac joints are congruent. Both  femoral heads are well-seated in the respective acetabula. IMPRESSION: No evidence of pelvic fracture. Electronically Signed   By: Jeb Levering M.D.   On: 07/07/2015 23:45   Dg Chest Portable 1 View  07/07/2015  CLINICAL DATA:  Trauma.  Fall downstairs. EXAM: PORTABLE CHEST 1 VIEW COMPARISON:  None. FINDINGS: Left lateral lower hemithorax excluded from the field of view. The lungs are hyperinflated with probable emphysematous change. Heart size and mediastinal contours are normal. There is no evidence pneumothorax, large pleural effusion or focal consolidation. No evidence of displaced rib fracture. Probable intra-articular bodies about the right shoulder. IMPRESSION: Hyperinflation and probable emphysema. No evidence of acute traumatic process. Left costophrenic angle excluded from field of view. Electronically Signed   By: Jeb Levering M.D.   On: 07/07/2015 23:44   Dg Tibia/fibula Left Port  07/07/2015  CLINICAL DATA:  Trauma, fall downstairs. EXAM: PORTABLE LEFT TIBIA AND FIBULA - 2 VIEW COMPARISON:  None. FINDINGS: Cortical margins of the tibia and fibula are intact. No evidence of fracture. Knee and ankle alignment grossly maintained. There is anterior soft tissue edema. No radiopaque foreign body or soft tissue air. IMPRESSION: Anterior soft tissue edema.  No foreign body or fracture Electronically Signed   By: Jeb Levering M.D.   On: 07/07/2015 23:46   Dg Tibia/fibula Right Port  07/07/2015  CLINICAL DATA:  Trauma.  Fell downstairs. EXAM: PORTABLE RIGHT TIBIA AND FIBULA - 2 VIEW COMPARISON:  None. FINDINGS: Previous screw fixation of the proximal right tibia. There is soft tissue laceration along the anterior aspect of the lower leg. No underlying fracture or dislocation. IMPRESSION: 1. Anterior soft tissue laceration. 2. No fracture identified. Electronically Signed   By: Kerby Moors M.D.   On: 07/07/2015 23:46    Review of Systems  Constitutional: Negative.   HENT: Negative.    Eyes: Negative.   Respiratory: Negative.   Cardiovascular: Negative.   Gastrointestinal: Negative.   Genitourinary: Negative.   Musculoskeletal: Negative.   Skin: Negative.   Psychiatric/Behavioral: Negative.    Blood pressure 125/48, pulse 97, temperature 98.3 F (36.8 C), temperature source Oral, resp. rate 18, height _0  (1.6 m), weight 83.598 kg (184 lb 4.8 oz), SpO2 89 %. Physical Exam  Constitutional: She is oriented to person, place, and time. She appears well-developed and well-nourished.  HENT:  Head: Normocephalic and atraumatic.  Eyes: EOM are normal. Pupils are equal, round, and reactive to light.  Cardiovascular: Normal rate.   Respiratory: Effort normal. No respiratory distress.  GI: Soft.  Musculoskeletal:       Legs: Neurological: She is alert and oriented to person, place, and time.  Skin: There is erythema.  Psychiatric: She has a normal mood and affect. Her behavior is normal. Judgment and thought content normal.    Assessment/Plan: Recommend multivitamin daily, Vit C 500 mg daily, Zinc 220 mg daily, increase protein and wet to dry dressings daily.  Plan on OR for debridement with Acell and VAC placement to right leg wound.  Wallace Going 07/09/2015, 2:42 PM

## 2015-07-09 NOTE — Progress Notes (Signed)
PROGRESS NOTE  Meghan BullsLanice B Wagar IRJ:[id number] DOB: 14-May-1950 DOA: 07/07/2015 PCP: No primary care provider on file.  HPI/Recap of past 24 hours: 62 your patient with past medical history of COPD suffered mechanical fall on 4/8 suffering a large abrasion to her right lower extremity. She came in that night and seen by general surgery who recommended debridement by either general surgery or plastic surgery.  Patient seen by plastic surgery who agree with plans to take patient to operating room potentially tomorrow or later this week.  Patient herself doing okay with no complaints. Pain well-controlled  Assessment/Plan: Principal Problem:   Fall (on) (from) other stairs and steps, initial encounter Active Problems:   Abrasion of right lower extremity/right lower extremity wound: For or debridement, hopefully tomorrow COPD: Stable Essential hypertension: Continue Cozaar  Code Status: Full code   Family Communication: No family present   Disposition Plan: If surgery is to be delayed past 4/12, we'll plan for discharge patient tomorrow with home health   Consultants: General surgery Plastic surgery  Procedures:  None   Antibiotics:  None    Objective: BP 125/48 mmHg  Pulse 97  Temp(Src) 98.3 F (36.8 C) (Oral)  Resp 18  Ht 5\' 3"  (1.6 m)  Wt 83.598 kg (184 lb 4.8 oz)  BMI 32.66 kg/m2  SpO2 89%  Intake/Output Summary (Last 24 hours) at 07/09/15 1729 Last data filed at 07/09/15 1700  Gross per 24 hour  Intake    240 ml  Output   1550 ml  Net  -1310 ml   Filed Weights   07/08/15 0449  Weight: 83.598 kg (184 lb 4.8 oz)    Exam:   General:  Alert and oriented 3, no acute distress   Cardiovascular: Regular rate and rhythm, S1-S2   Respiratory: Clear to auscultation bilaterally   Abdomen: Soft, nontender, nondistended, positive bowel sounds   Musculoskeletal: Right lower extremity wrapped    Data Reviewed: Basic Metabolic Panel:  Recent Labs Lab  07/07/15 2323  NA 138  K 4.1  CL 105  CO2 20*  GLUCOSE 115*  BUN 6  CREATININE 0.90  CALCIUM 9.1   Liver Function Tests:  Recent Labs Lab 07/07/15 2323  AST 21  ALT 20  ALKPHOS 58  BILITOT 0.3  PROT 6.3*  ALBUMIN 3.6   No results for input(s): LIPASE, AMYLASE in the last 168 hours. No results for input(s): AMMONIA in the last 168 hours. CBC:  Recent Labs Lab 07/07/15 2323  WBC 8.7  HGB 13.3  HCT 41.1  MCV 89.7  PLT 244   Cardiac Enzymes:    Recent Labs Lab 07/07/15 2323  TROPONINI <0.03   BNP (last 3 results) No results for input(s): BNP in the last 8760 hours.  ProBNP (last 3 results) No results for input(s): PROBNP in the last 8760 hours.  CBG: No results for input(s): GLUCAP in the last 168 hours.  No results found for this or any previous visit (from the past 240 hour(s)).   Studies: No results found.  Scheduled Meds: . ascorbic acid  500 mg Oral Daily  . ipratropium-albuterol  3 mL Nebulization BID  . losartan  25 mg Oral Daily  . mometasone-formoterol  2 puff Inhalation BID  . montelukast  10 mg Oral q morning - 10a  . multivitamin with minerals  1 tablet Oral Daily  . nystatin  5 mL Oral QID  . zinc sulfate  220 mg Oral Daily    Continuous Infusions:  Time spent:15 min   Hollice Espy  [facility] Pager [phone] . If 7PM-7AM, please contact night-coverage at [email or web address], password [facility] 07/09/2015, 5:29 PM

## 2015-07-10 ENCOUNTER — Encounter (HOSPITAL_COMMUNITY): Payer: Medicare Other

## 2015-07-10 ENCOUNTER — Other Ambulatory Visit: Payer: Self-pay | Admitting: Plastic Surgery

## 2015-07-10 DIAGNOSIS — W108XXA Fall (on) (from) other stairs and steps, initial encounter: Secondary | ICD-10-CM | POA: Diagnosis not present

## 2015-07-10 DIAGNOSIS — S80811A Abrasion, right lower leg, initial encounter: Secondary | ICD-10-CM | POA: Diagnosis not present

## 2015-07-10 DIAGNOSIS — S81811A Laceration without foreign body, right lower leg, initial encounter: Secondary | ICD-10-CM | POA: Diagnosis not present

## 2015-07-10 DIAGNOSIS — I10 Essential (primary) hypertension: Secondary | ICD-10-CM | POA: Diagnosis not present

## 2015-07-10 DIAGNOSIS — S81801A Unspecified open wound, right lower leg, initial encounter: Secondary | ICD-10-CM

## 2015-07-10 DIAGNOSIS — S81801D Unspecified open wound, right lower leg, subsequent encounter: Secondary | ICD-10-CM | POA: Diagnosis not present

## 2015-07-10 MED ORDER — ACETAMINOPHEN 325 MG PO TABS
650.0000 mg | ORAL_TABLET | Freq: Four times a day (QID) | ORAL | Status: DC | PRN
  Administered 2015-07-10: 650 mg via ORAL
  Filled 2015-07-10: qty 2

## 2015-07-10 NOTE — Care Management Note (Signed)
Case Management Note  Patient Details  Name: Meghan [name] MRN: [id number] Date of Birth: 26-Jan-1951  Subjective/Objective:                 Spoke with patient in the room, she states she will have surgery Thursday. Patient will DC to home today with wound VAC through [facility], Oralia Rudicky Toye RN with [facility] on unit assistaing with RN CM and MD to set up [facility] today. [facility], and rolator prior to discharge.   Action/Plan:  Patient will DC today to home with wound VAC and HH RN Expected Discharge Date:                  Expected Discharge Plan:  Home w Home Health Services  In-House Referral:     Discharge planning Services  CM Consult  Post Acute Care Choice:  Home Health, Durable Medical Equipment Choice offered to:     DME Arranged:  Vac DME Agency:  [facility]  HH Arranged:  RN HH Agency:  [facility]  Status of Service:  Completed, signed off  Medicare Important Message Given:    Date Medicare IM Given:    Medicare IM give by:    Date Additional Medicare IM Given:    Additional Medicare Important Message give by:     If discussed at Long Length of Stay Meetings, dates discussed:    Additional Comments:  Lawerance SabalDebbie Agostino Gorin, RN 07/10/2015, 12:18 PM

## 2015-07-10 NOTE — Progress Notes (Signed)
Meghan [name] to be D/C'd Home per MD order.  Discussed with the patient and all questions fully answered.    Medication List    TAKE these medications        albuterol 108 (90 Base) MCG/ACT inhaler  Commonly known as:  PROVENTIL HFA;VENTOLIN HFA  Inhale 2 puffs into the lungs every 6 (six) hours as needed for wheezing or shortness of breath.     budesonide-formoterol 160-4.5 MCG/ACT inhaler  Commonly known as:  SYMBICORT  Inhale 2 puffs into the lungs 2 (two) times daily.     losartan 25 MG tablet  Commonly known as:  COZAAR  Take 25 mg by mouth daily.     montelukast 10 MG tablet  Commonly known as:  SINGULAIR  Take 10 mg by mouth every morning.     nystatin 100000 UNIT/ML suspension  Commonly known as:  MYCOSTATIN  Take 5 mLs by mouth 4 (four) times daily.     VITAMIN D PO  Take 1 tablet by mouth daily.        VVS, Skin clean, dry and Wound vac intact without evidence of complication IV catheter discontinued intact. Site without signs and symptoms of complications. Dressing and pressure applied.  An After Visit Summary was printed and given to the patient.  D/c education completed with patient/family including follow up instructions, medication list, d/c activities limitations if indicated, with other d/c instructions as indicated by MD - patient able to verbalize understanding, all questions fully answered.   Patient instructed to return to ED, call 911, or call MD for any changes in condition.   Patient escorted via WC, and D/C home via private auto.  Sandrea HughsJeter, Meghan [name] 07/10/2015

## 2015-07-10 NOTE — Discharge Instructions (Signed)
Nothing to eat after midnight, Wednesday night before surgery  Vacuum-Assisted Closure Therapy Vacuum-assisted closure (VAC) therapy uses a device that removes fluid and germs from wounds to help them heal. It is used on wounds that cannot be closed with stitches. They often heal slowly. Vacuum-assisted therapy helps the wound stay clean and healthy while the open wound slowly grows back together. Vacuum-assisted closure therapy uses a bandage (dressing) that is made of foam. It is put inside the wound. Then, a drape is placed over the wound. This drape sticks to your skin to keep air out, and to protect the wound. A tube is hooked up to a small pump and is attached to the drape. The pump sucks out the fluid and germs. Vacuum-assisted closure therapy can also help reduce the bad smell that comes from the wound. HOW DOES IT WORK?  The vacuum pump pulls fluid through the foam dressing. The dressing may wrinkle during this process. The fluid goes into the tube and away from the wound. The fluid then goes into a container. The fluid in the container must be replaced if it is full or at least once a week, even if the container is not full. The pulling from the pump helps to close the wound and bring better circulation to the wound area. The foam dressing covers and protects the wound. It helps your wound heal faster.  HOW DOES IT FEEL?   You might feel a little pulling when the pump is on.  You might also feel a mild vibrating sensation.  You might feel some discomfort when the dressing is taken off. CAN I MOVE AROUND WITH VACUUM-ASSISTED CLOSURE THERAPY? Yes, it has a backup battery which is used when the machine is not plugged in, as long as the battery is working, you can move freely. WHAT ARE SOME THINGS I MUST KNOW?  Do not turn off the pump yourself, unless instructed to do so by your healthcare provider, such as for bathing.  Do not take off the dressing yourself, unless instructed to do so  by your caregiver.  You can wash or shower with the dressing. However, do not take the pump into the shower. Make sure the wound dressing is protected and covered with plastic. The wound area must stay dry.  Do not turn off the pump for more than 2 hours. If the pump is off for more than 2 hours, your nurse must change your dressing.  Check frequently that the machine is on, that the machine indicates the therapy is on, and that all clamps are open. THE ALARM IS SOUNDING! WHAT SHOULD I DO?   Stay calm.  Do not turn off the pump or do anything with the dressing.  Call your clinic or caregiver right away if the alarm goes off and you cannot fix the problem. Some reasons the alarm might go off include:  The fluid collection container is full.  The battery is low.  The dressing has a leak.  Explain to your caregiver what is happening. Follow the instructions you receive. WHEN SHOULD I CALL FOR HELP?   You have severe pain.  You have difficulty breathing.  You have bleeding that will not stop.  Your wound smells bad.  You have redness, swelling, or fluid leaking from your wound.  Your alarm goes off and you do not know what to do.  You have a fever.  Your wound itches severely.  Your dressing changes are often painful or bleeding often occurs.  You have diarrhea.  You have a sore throat.  You have a rash around the dressing or anywhere else on your body.  You feel nauseous.  You feel dizzy or weak.  The [facility] machine has been off for more than 2 hours. HOW DO I GET READY TO GO HOME WITH A PUMP?  A trained caregiver will talk to you and answer your questions about your vacuum-assisted closure therapy before you go home. He or she will explain what to expect. A caregiver will come to your home to apply the pump and care for your wound. The at-home caregiver will be available for questions and will come back for the scheduled dressing changes, usually every 48-72 hours  (or more often for severely infected wounds). Your at-home caregiver will also come if you are having an unexpected problem. If you have questions or do not know what to do when you go home, talk to your healthcare provider.   This information is not intended to replace advice given to you by your health care provider. Make sure you discuss any questions you have with your health care provider.   Document Released: 02/28/2008 Document Revised: 11/17/2012 Document Reviewed: 02/28/2011 Elsevier Interactive Patient Education [employer]

## 2015-07-10 NOTE — Progress Notes (Signed)
Patient has concerns about the way her care has been brought about.  Patient is very upset that she has not been placed on the OR schedule at this time.  She stated that a plastics came to see her and stated that she will be having surgery today but she has no orders and is not on the OR schedule.  Paged dr Rito EhrlichKrishnan to come see her when he was available

## 2015-07-10 NOTE — Discharge Summary (Signed)
Discharge Summary  Meghan [name]:[id number] DOB: [birth year]  PCP: No primary care provider on file.  Admit date: 07/07/2015 Discharge date: 07/10/2015  Time spent: 35 minutes  Recommendations for Outpatient Follow-up:  1. Patient will be discharged with home health nurse and wound VAC for right lower extremity 2. Patient will follow-up on 4/13 for debridement of right lower extremity wound. Surgery scheduled for 2:45 PM. She is instructed to be at pre-admit 1-2 hours before surgery  Discharge Diagnoses:  Active Hospital Problems   Diagnosis Date Noted  . Fall (on) (from) other stairs and steps, initial encounter 07/08/2015  . Abrasion of right lower extremity 07/08/2015    Resolved Hospital Problems   Diagnosis Date Noted Date Resolved  No resolved problems to display.    Discharge Condition: Stable, being discharged home  Diet recommendation: Low-sodium  Filed Vitals:   07/09/15 2128 07/10/15 0448  BP: 145/60 115/45  Pulse: 99 91  Temp: 98.9 F (37.2 C) 98 F (36.7 C)  Resp: 18 18    History of present illness:  39 your patient with past medical history of COPD suffered mechanical fall on 4/8 suffering a large abrasion to her right lower extremity. She came in that night and seen by general surgery who recommended debridement by either general surgery or plastic surgery.  Hospital Course:  Principal Problem:   Fall (on) (from) other stairs and steps, initial encounter leading to abrasion and then abrasion leading to wound of right lower extremity with skin necrosis and wound down to muscle and tendon: Evaluated by general surgery who contacted plastic surgery. Plastic surgery evaluated patient and initially attempt was made to have patient on surgery scheduled for today, 4/11. Unable to be done and patient was placed on our schedule for Thursday, 4/13. In the interim, patient will be discharged home with home health RN and wound VAC. Pain is well-controlled and she  declined any pain medications  Active Problems: Hypertension: Continue Cozaar  Procedures:  Planned debridement of right lower extremity wound on 4/13   Consultations:  General surgery  Plastic surgery   Discharge Exam: BP 115/45 mmHg  Pulse 91  Temp(Src) 98 F (36.7 C) (Oral)  Resp 18  Ht  (1.6 m)  Wt 83.598 kg (184 lb 4.8 oz)  BMI 32.66 kg/m2  SpO2 91%  General: Alert and oriented 3, no acute distress  Cardiovascular: Regular rate and rhythm, S1-S2  Respiratory: Clear to auscultation bilaterally   Discharge Instructions You were cared for by a hospitalist during your hospital stay. If you have any questions about your discharge medications or the care you received while you were in the hospital after you are discharged, you can call the unit and asked to speak with the hospitalist on call if the hospitalist that took care of you is not available. Once you are discharged, your primary care physician will handle any further medical issues. Please note that NO REFILLS for any discharge medications will be authorized once you are discharged, as it is imperative that you return to your primary care physician (or establish a relationship with a primary care physician if you do not have one) for your aftercare needs so that they can reassess your need for medications and monitor your lab values.  Discharge Instructions    Diet - low sodium heart healthy    Complete by:  As directed      Discharge wound care:    Complete by:  As directed   Wound Vac  as per instructions     Increase activity slowly    Complete by:  As directed             Medication List    TAKE these medications        albuterol 108 (90 Base) MCG/ACT inhaler  Commonly known as:  PROVENTIL HFA;VENTOLIN HFA  Inhale 2 puffs into the lungs every 6 (six) hours as needed for wheezing or shortness of breath.     budesonide-formoterol 160-4.5 MCG/ACT inhaler  Commonly known as:  SYMBICORT  Inhale 2 puffs  into the lungs 2 (two) times daily.     losartan 25 MG tablet  Commonly known as:  COZAAR  Take 25 mg by mouth daily.     montelukast 10 MG tablet  Commonly known as:  SINGULAIR  Take 10 mg by mouth every morning.     nystatin 100000 UNIT/ML suspension  Commonly known as:  MYCOSTATIN  Take 5 mLs by mouth 4 (four) times daily.     VITAMIN D PO  Take 1 tablet by mouth daily.       Allergies  Allergen Reactions  . Fosamax [Alendronate Sodium] Other (See Comments)    "indegestion"        Follow-up Information    Follow up with Surgery at 2:45pm.   Contact information:   Show up at Pre-Admit 1-2 hours before surgery.  Please come to the [facility] on [address]) & the information desk will direct you to the pre-admit area.       The results of significant diagnostics from this hospitalization (including imaging, microbiology, ancillary and laboratory) are listed below for reference.    Significant Diagnostic Studies: Dg Pelvis Portable  07/07/2015  CLINICAL DATA:  Trauma, fall downstairs. EXAM: PORTABLE PELVIS 1-2 VIEWS COMPARISON:  None. FINDINGS: The cortical margins of the bony pelvis are intact. No fracture. Pubic symphysis and sacroiliac joints are congruent. Both femoral heads are well-seated in the respective acetabula. IMPRESSION: No evidence of pelvic fracture. Electronically Signed   By: Rubye Oaks M.D.   On: 07/07/2015 23:45   Dg Chest Portable 1 View  07/07/2015  CLINICAL DATA:  Trauma.  Fall downstairs. EXAM: PORTABLE CHEST 1 VIEW COMPARISON:  None. FINDINGS: Left lateral lower hemithorax excluded from the field of view. The lungs are hyperinflated with probable emphysematous change. Heart size and mediastinal contours are normal. There is no evidence pneumothorax, large pleural effusion or focal consolidation. No evidence of displaced rib fracture. Probable intra-articular bodies about the right shoulder. IMPRESSION: Hyperinflation and  probable emphysema. No evidence of acute traumatic process. Left costophrenic angle excluded from field of view. Electronically Signed   By: Rubye Oaks M.D.   On: 07/07/2015 23:44   Dg Tibia/fibula Left Port  07/07/2015  CLINICAL DATA:  Trauma, fall downstairs. EXAM: PORTABLE LEFT TIBIA AND FIBULA - 2 VIEW COMPARISON:  None. FINDINGS: Cortical margins of the tibia and fibula are intact. No evidence of fracture. Knee and ankle alignment grossly maintained. There is anterior soft tissue edema. No radiopaque foreign body or soft tissue air. IMPRESSION: Anterior soft tissue edema.  No foreign body or fracture Electronically Signed   By: Rubye Oaks M.D.   On: 07/07/2015 23:46   Dg Tibia/fibula Right Port  07/07/2015  CLINICAL DATA:  Trauma.  Fell downstairs. EXAM: PORTABLE RIGHT TIBIA AND FIBULA - 2 VIEW COMPARISON:  None. FINDINGS: Previous screw fixation of the proximal right tibia. There is soft tissue laceration along the  anterior aspect of the lower leg. No underlying fracture or dislocation. IMPRESSION: 1. Anterior soft tissue laceration. 2. No fracture identified. Electronically Signed   By: Signa Kellaylor  Stroud M.D.   On: 07/07/2015 23:46    Microbiology: No results found for this or any previous visit (from the past 240 hour(s)).   Labs: Basic Metabolic Panel:  Recent Labs Lab 07/07/15 2323  NA 138  K 4.1  CL 105  CO2 20*  GLUCOSE 115*  BUN 6  CREATININE 0.90  CALCIUM 9.1   Liver Function Tests:  Recent Labs Lab 07/07/15 2323  AST 21  ALT 20  ALKPHOS 58  BILITOT 0.3  PROT 6.3*  ALBUMIN 3.6   No results for input(s): LIPASE, AMYLASE in the last 168 hours. No results for input(s): AMMONIA in the last 168 hours. CBC:  Recent Labs Lab 07/07/15 2323  WBC 8.7  HGB 13.3  HCT 41.1  MCV 89.7  PLT 244   Cardiac Enzymes:  Recent Labs Lab 07/07/15 2323  TROPONINI <0.03   BNP: BNP (last 3 results) No results for input(s): BNP in the last 8760 hours.  ProBNP  (last 3 results) No results for input(s): PROBNP in the last 8760 hours.  CBG: No results for input(s): GLUCAP in the last 168 hours.     Signed:  Hollice EspyKRISHNAN,Aracely Rickett K  [facility] 07/10/2015, 12:20 PM

## 2015-07-10 NOTE — Progress Notes (Signed)
  Subjective: Complains of mild leg discomfort.   Objective: Vital signs in last 24 hours: Temp:  [98 F (36.7 C)-98.9 F (37.2 C)] 98 F (36.7 C) (04/11 0448) Pulse Rate:  [91-102] 91 (04/11 0448) Resp:  [18] 18 (04/11 0448) BP: (115-145)/(45-60) 115/45 mmHg (04/11 0448) SpO2:  [89 %-98 %] 94 % (04/11 0448) Last BM Date: 07/08/15  Intake/Output from previous day: 04/10 0701 - 04/11 0700 In: 240 [P.O.:240] Out: 1550 [Urine:1550] Intake/Output this shift:    Right leg dressing intact  Lab Results:   Recent Labs  07/07/15 2323  WBC 8.7  HGB 13.3  HCT 41.1  PLT 244   BMET  Recent Labs  07/07/15 2323  NA 138  K 4.1  CL 105  CO2 20*  GLUCOSE 115*  BUN 6  CREATININE 0.90  CALCIUM 9.1   PT/INR  Recent Labs  07/07/15 2323  LABPROT 13.6  INR 1.02   ABG No results for input(s): PHART, HCO3 in the last 72 hours.  Invalid input(s): PCO2, PO2  Studies/Results: No results found.  Anti-infectives: Anti-infectives    None      Assessment/Plan:  Right leg wound s/p fall  According to note from Plastic Surgery (Dr. Ulice Boldillingham) patient is going to be taken to the OR for wound care.  She is under the impression that surgery is today and she is NPO.  Needs to be clarified with Plastics prior to disposition plans.  From a general trauma standpoint, there is nothing further for us to do so will sign off. Available if needed.     Soriah Leeman A 07/10/2015

## 2015-07-11 ENCOUNTER — Other Ambulatory Visit: Payer: Self-pay | Admitting: Plastic Surgery

## 2015-07-11 ENCOUNTER — Encounter (HOSPITAL_COMMUNITY): Payer: Self-pay | Admitting: *Deleted

## 2015-07-11 ENCOUNTER — Encounter (HOSPITAL_COMMUNITY): Payer: Self-pay

## 2015-07-11 DIAGNOSIS — Z4800 Encounter for change or removal of nonsurgical wound dressing: Secondary | ICD-10-CM | POA: Diagnosis not present

## 2015-07-11 DIAGNOSIS — S80811D Abrasion, right lower leg, subsequent encounter: Secondary | ICD-10-CM | POA: Diagnosis not present

## 2015-07-11 DIAGNOSIS — F17219 Nicotine dependence, cigarettes, with unspecified nicotine-induced disorders: Secondary | ICD-10-CM | POA: Diagnosis not present

## 2015-07-11 DIAGNOSIS — W109XXD Fall (on) (from) unspecified stairs and steps, subsequent encounter: Secondary | ICD-10-CM | POA: Diagnosis not present

## 2015-07-11 DIAGNOSIS — J449 Chronic obstructive pulmonary disease, unspecified: Secondary | ICD-10-CM | POA: Diagnosis not present

## 2015-07-11 DIAGNOSIS — I10 Essential (primary) hypertension: Secondary | ICD-10-CM | POA: Diagnosis not present

## 2015-07-11 MED ORDER — CEFAZOLIN SODIUM-DEXTROSE 2-4 GM/100ML-% IV SOLN
2.0000 g | INTRAVENOUS | Status: AC
  Administered 2015-07-12: 2 g via INTRAVENOUS
  Filled 2015-07-11: qty 100

## 2015-07-12 ENCOUNTER — Encounter (HOSPITAL_COMMUNITY): Payer: Self-pay | Admitting: Plastic Surgery

## 2015-07-12 ENCOUNTER — Ambulatory Visit (HOSPITAL_COMMUNITY)
Admission: RE | Admit: 2015-07-12 | Discharge: 2015-07-12 | Disposition: A | Discharge status: Home / Self Care | Payer: Medicare Other | Source: Ambulatory Visit | Attending: Plastic Surgery | Admitting: Plastic Surgery

## 2015-07-12 ENCOUNTER — Inpatient Hospital Stay (HOSPITAL_COMMUNITY): Payer: Medicare Other | Admitting: Certified Registered Nurse Anesthetist

## 2015-07-12 ENCOUNTER — Encounter (HOSPITAL_COMMUNITY)
Admission: RE | Disposition: A | Discharge status: Home / Self Care | Payer: Self-pay | Source: Ambulatory Visit | Attending: Plastic Surgery

## 2015-07-12 ENCOUNTER — Encounter (HOSPITAL_COMMUNITY): Payer: Medicare Other

## 2015-07-12 DIAGNOSIS — Z87891 Personal history of nicotine dependence: Secondary | ICD-10-CM | POA: Insufficient documentation

## 2015-07-12 DIAGNOSIS — S81801S Unspecified open wound, right lower leg, sequela: Secondary | ICD-10-CM | POA: Diagnosis not present

## 2015-07-12 DIAGNOSIS — Z79899 Other long term (current) drug therapy: Secondary | ICD-10-CM | POA: Diagnosis not present

## 2015-07-12 DIAGNOSIS — Z7951 Long term (current) use of inhaled steroids: Secondary | ICD-10-CM | POA: Diagnosis not present

## 2015-07-12 DIAGNOSIS — W109XXA Fall (on) (from) unspecified stairs and steps, initial encounter: Secondary | ICD-10-CM | POA: Diagnosis not present

## 2015-07-12 DIAGNOSIS — S81801A Unspecified open wound, right lower leg, initial encounter: Secondary | ICD-10-CM | POA: Diagnosis not present

## 2015-07-12 DIAGNOSIS — I10 Essential (primary) hypertension: Secondary | ICD-10-CM | POA: Insufficient documentation

## 2015-07-12 DIAGNOSIS — J449 Chronic obstructive pulmonary disease, unspecified: Secondary | ICD-10-CM | POA: Insufficient documentation

## 2015-07-12 HISTORY — DX: Pneumonia, unspecified organism: J18.9

## 2015-07-12 HISTORY — PX: INCISION AND DRAINAGE OF WOUND: SHX1803

## 2015-07-12 LAB — BASIC METABOLIC PANEL
ANION GAP: 13 (ref 5–15)
BUN: 10 mg/dL (ref 6–20)
CHLORIDE: 105 mmol/L (ref 101–111)
CO2: 22 mmol/L (ref 22–32)
Calcium: 9.3 mg/dL (ref 8.9–10.3)
Creatinine, Ser: 0.83 mg/dL (ref 0.44–1.00)
GFR calc non Af Amer: >60 mL/min (ref >60)
Glucose, Bld: 130 mg/dL — ABNORMAL HIGH (ref 65–99)
POTASSIUM: 4.2 mmol/L (ref 3.5–5.1)
Sodium: 140 mmol/L (ref 135–145)

## 2015-07-12 LAB — CBC
HCT: 41.1 % (ref 36.0–46.0)
HEMOGLOBIN: 14 g/dL (ref 12.0–15.0)
MCH: 30 pg (ref 26.0–34.0)
MCHC: 34.1 g/dL (ref 30.0–36.0)
MCV: 88.2 fL (ref 78.0–100.0)
Platelets: 276 10*3/uL (ref 150–400)
RBC: 4.66 MIL/uL (ref 3.87–5.11)
RDW: 13.5 % (ref 11.5–15.5)
WBC: 8.2 10*3/uL (ref 4.0–10.5)

## 2015-07-12 SURGERY — IRRIGATION AND DEBRIDEMENT WOUND
Anesthesia: General | Site: Leg Lower | Laterality: Right

## 2015-07-12 MED ORDER — SODIUM CHLORIDE 0.9 % IV SOLN
10000.0000 ug | INTRAVENOUS | Status: DC | PRN
  Administered 2015-07-12: 120 ug via INTRAVENOUS
  Administered 2015-07-12: 160 ug via INTRAVENOUS

## 2015-07-12 MED ORDER — ONDANSETRON HCL 4 MG/2ML IJ SOLN
INTRAMUSCULAR | Status: DC | PRN
  Administered 2015-07-12: 4 mg via INTRAVENOUS

## 2015-07-12 MED ORDER — VITAMIN A-BETA CAROTENE 25000 UNITS PO CAPS: 10000.0000 [IU] | ORAL_CAPSULE | Freq: Every day | ORAL | Status: DC

## 2015-07-12 MED ORDER — SODIUM CHLORIDE 0.9 % IR SOLN
Status: DC | PRN
  Administered 2015-07-12: 500 mL

## 2015-07-12 MED ORDER — LACTATED RINGERS IV SOLN
INTRAVENOUS | Status: DC
  Administered 2015-07-12 (×3): via INTRAVENOUS

## 2015-07-12 MED ORDER — OXYCODONE-ACETAMINOPHEN 7.5-325 MG PO TABS: 1.0000 | ORAL_TABLET | Freq: Once | ORAL | Status: DC

## 2015-07-12 MED ORDER — FENTANYL CITRATE (PF) 250 MCG/5ML IJ SOLN
INTRAMUSCULAR | Status: DC | PRN
  Administered 2015-07-12: 50 ug via INTRAVENOUS
  Administered 2015-07-12: 25 ug via INTRAVENOUS
  Administered 2015-07-12: 50 ug via INTRAVENOUS

## 2015-07-12 MED ORDER — LIDOCAINE HCL (CARDIAC) 20 MG/ML IV SOLN
INTRAVENOUS | Status: DC | PRN
  Administered 2015-07-12: 60 mg via INTRAVENOUS

## 2015-07-12 MED ORDER — PROPOFOL 10 MG/ML IV BOLUS
INTRAVENOUS | Status: AC
  Filled 2015-07-12: qty 40

## 2015-07-12 MED ORDER — MIDAZOLAM HCL 2 MG/2ML IJ SOLN
INTRAMUSCULAR | Status: AC
  Filled 2015-07-12: qty 2

## 2015-07-12 MED ORDER — MIDAZOLAM HCL 5 MG/5ML IJ SOLN
INTRAMUSCULAR | Status: DC | PRN
  Administered 2015-07-12: 2 mg via INTRAVENOUS

## 2015-07-12 MED ORDER — DEXAMETHASONE SODIUM PHOSPHATE 4 MG/ML IJ SOLN
INTRAMUSCULAR | Status: DC | PRN
  Administered 2015-07-12: 4 mg via INTRAVENOUS

## 2015-07-12 MED ORDER — PROPOFOL 10 MG/ML IV BOLUS
INTRAVENOUS | Status: DC | PRN
  Administered 2015-07-12: 200 mg via INTRAVENOUS

## 2015-07-12 MED ORDER — FENTANYL CITRATE (PF) 250 MCG/5ML IJ SOLN
INTRAMUSCULAR | Status: AC
  Filled 2015-07-12: qty 5

## 2015-07-12 MED ORDER — PHENYLEPHRINE HCL 10 MG/ML IJ SOLN
INTRAMUSCULAR | Status: DC | PRN
  Administered 2015-07-12 (×6): 80 ug via INTRAVENOUS

## 2015-07-12 MED ORDER — HYDROMORPHONE HCL 1 MG/ML IJ SOLN: 0.2500 mg | INTRAMUSCULAR | Status: DC | PRN

## 2015-07-12 MED ORDER — 0.9 % SODIUM CHLORIDE (POUR BTL) OPTIME
TOPICAL | Status: DC | PRN
  Administered 2015-07-12: 1000 mL

## 2015-07-12 MED ORDER — ZINC SULFATE 220 (50 ZN) MG PO CAPS: 220.0000 mg | ORAL_CAPSULE | Freq: Two times a day (BID) | ORAL | Status: DC

## 2015-07-12 SURGICAL SUPPLY — 49 items
BAG DECANTER FOR FLEXI CONT (MISCELLANEOUS) ×2 IMPLANT
BANDAGE ACE 4X5 VEL STRL LF (GAUZE/BANDAGES/DRESSINGS) ×2 IMPLANT
BENZOIN TINCTURE PRP APPL 2/3 (GAUZE/BANDAGES/DRESSINGS) ×2 IMPLANT
BNDG ELASTIC 2X5.8 VLCR STR LF (GAUZE/BANDAGES/DRESSINGS) ×2 IMPLANT
BNDG GAUZE ELAST 4 BULKY (GAUZE/BANDAGES/DRESSINGS) ×4 IMPLANT
CANISTER SUCTION 2500CC (MISCELLANEOUS) ×2 IMPLANT
CONT SPEC STER OR (MISCELLANEOUS) IMPLANT
COVER SURGICAL LIGHT HANDLE (MISCELLANEOUS) ×2 IMPLANT
DRAPE IMP U-DRAPE 54X76 (DRAPES) ×2 IMPLANT
DRAPE INCISE IOBAN 66X45 STRL (DRAPES) ×2 IMPLANT
DRAPE LAPAROSCOPIC ABDOMINAL (DRAPES) IMPLANT
DRAPE ORTHO SPLIT 77X108 STRL (DRAPES) ×1
DRAPE PED LAPAROTOMY (DRAPES) ×2 IMPLANT
DRAPE PROXIMA HALF (DRAPES) ×2 IMPLANT
DRAPE SURG ORHT 6 SPLT 77X108 (DRAPES) ×1 IMPLANT
DRESSING HYDROCOLLOID 4X4 (GAUZE/BANDAGES/DRESSINGS) ×2 IMPLANT
DRESSING HYDROCOLLOID 4X4 XTH (GAUZE/BANDAGES/DRESSINGS) ×2 IMPLANT
DRSG ADAPTIC 3X8 NADH LF (GAUZE/BANDAGES/DRESSINGS) IMPLANT
DRSG PAD ABDOMINAL 8X10 ST (GAUZE/BANDAGES/DRESSINGS) IMPLANT
DRSG VAC ATS LRG SENSATRAC (GAUZE/BANDAGES/DRESSINGS) ×2 IMPLANT
DRSG VAC ATS MED SENSATRAC (GAUZE/BANDAGES/DRESSINGS) IMPLANT
DRSG VAC ATS SM SENSATRAC (GAUZE/BANDAGES/DRESSINGS) IMPLANT
ELECT CAUTERY BLADE 6.4 (BLADE) ×2 IMPLANT
ELECT REM PT RETURN 9FT ADLT (ELECTROSURGICAL) ×2
ELECTRODE REM PT RTRN 9FT ADLT (ELECTROSURGICAL) ×1 IMPLANT
GAUZE SPONGE 4X4 12PLY STRL (GAUZE/BANDAGES/DRESSINGS) IMPLANT
GLOVE BIO SURGEON STRL SZ 6.5 (GLOVE) ×8 IMPLANT
GLOVE BIOGEL PI IND STRL 7.0 (GLOVE) ×1 IMPLANT
GLOVE BIOGEL PI INDICATOR 7.0 (GLOVE) ×1
GLOVE SURG SS PI 7.0 STRL IVOR (GLOVE) ×2 IMPLANT
GOWN STRL REUS W/ TWL LRG LVL3 (GOWN DISPOSABLE) ×3 IMPLANT
GOWN STRL REUS W/TWL LRG LVL3 (GOWN DISPOSABLE) ×3
KIT BASIN OR (CUSTOM PROCEDURE TRAY) ×2 IMPLANT
KIT ROOM TURNOVER OR (KITS) ×2 IMPLANT
MATRIX SURGICAL PSM 10X15CM (Tissue) ×2 IMPLANT
MICROMATRIX 1000MG (Tissue) ×2 IMPLANT
NS IRRIG 1000ML POUR BTL (IV SOLUTION) ×2 IMPLANT
PACK GENERAL/GYN (CUSTOM PROCEDURE TRAY) ×2 IMPLANT
PACK UNIVERSAL I (CUSTOM PROCEDURE TRAY) ×2 IMPLANT
PAD ARMBOARD 7.5X6 YLW CONV (MISCELLANEOUS) ×4 IMPLANT
SOLUTION PARTIC MCRMTRX 1000MG (Tissue) ×1 IMPLANT
STAPLER VISISTAT 35W (STAPLE) ×2 IMPLANT
SURGILUBE 2OZ TUBE FLIPTOP (MISCELLANEOUS) ×2 IMPLANT
SUT SILK 4 0 RB 1 (SUTURE) ×2 IMPLANT
SUT VIC AB 5-0 PS2 18 (SUTURE) ×6 IMPLANT
SWAB COLLECTION DEVICE MRSA (MISCELLANEOUS) IMPLANT
TOWEL OR 17X26 10 PK STRL BLUE (TOWEL DISPOSABLE) ×2 IMPLANT
TUBE ANAEROBIC SPECIMEN COL (MISCELLANEOUS) IMPLANT
UNDERPAD 30X30 INCONTINENT (UNDERPADS AND DIAPERS) ×2 IMPLANT

## 2015-07-12 NOTE — Anesthesia Postprocedure Evaluation (Signed)
Anesthesia Post Note  Patient: Meghan [name]  Procedure(s) Performed: Procedure(s) (LRB): IRRIGATION AND DEBRIDEMENT RIGHT LEG WOUND WITH ACELL AND VAC (Right)  Patient location during evaluation: PACU Anesthesia Type: General Level of consciousness: awake Pain management: pain level controlled Vital Signs Assessment: post-procedure vital signs reviewed and stable Respiratory status: spontaneous breathing Cardiovascular status: stable Anesthetic complications: no    Last Vitals:  Filed Vitals:   07/12/15 1425 07/12/15 1438  BP: 130/69   Pulse: 99 99  Temp:    Resp: 18     Last Pain:  Filed Vitals:   07/12/15 1443  PainSc: 1                  EDWARDS,Lamar Meter

## 2015-07-12 NOTE — Op Note (Signed)
Operative Note   DATE OF OPERATION: 07/12/2015  [facility]  SURGICAL DIVISION: Plastic Surgery  PREOPERATIVE DIAGNOSES:  Right leg wound  10 x 15 cm  POSTOPERATIVE DIAGNOSES:  same  PROCEDURE:  [place] Excisional debridement for preparation of right leg wound for placement of Acell (powder 1 gm and sheet 10 x 15 cm), placement of VAC dressing  SURGEON: Naidelyn Parrella Sanger Rony Ratz, DO  ANESTHESIA:  General.   COMPLICATIONS: None.   INDICATIONS FOR PROCEDURE:  The patient, Meghan [name] is a 65 y.o. female born on 07-21-1950, is here for treatment of a right leg wound that occurred one week ago when she fell up the steps and sustained a degloving injury. MRN: [id number]  CONSENT:  Informed consent was obtained directly from the patient. Risks, benefits and alternatives were fully discussed. Specific risks including but not limited to bleeding, infection, hematoma, seroma, scarring, pain, infection, contracture, asymmetry, wound healing problems, and need for further surgery were all discussed. The patient did have an ample opportunity to have questions answered to satisfaction.   DESCRIPTION OF PROCEDURE:  The patient was taken to the operating room. SCD was placed on the left leg and IV antibiotics were given. The patient's operative site was prepped and draped in a sterile fashion. A time out was performed and all information was confirmed to be correct.  General anesthesia was administered.  The antibiotic solution and saline were used to irrigate the wound of the right leg.  The scissors and #10 blade were used to debride the skin and soft tissue 4 x 8 cm.  Hemostasis was achieved with electrocautery.  The Acell powder (1 gm) and sheet (10 x 15 cm) were applied and secured with 5-0 Vicryl.  The sorbact was sutured over the Acell with 4-0 sllk.  KY gel and the VAC were applied and there was an excellent seal. The leg was wrapped with kerlex and an ace wrap. The  patient tolerated the procedure well.  There were no complications. The patient was allowed to wake from anesthesia, extubated and taken to the recovery room in satisfactory condition.

## 2015-07-12 NOTE — H&P (View-Only) (Signed)
Reason for Consult:right leg wound Referring Physician: Dr. Georganna Skeans  Meghan [name] is an 65 y.o. female.  HPI: The patient is a 65 yrs old wf here for treatment after a fall down her steps at home.  She has severe COPD and on steroids for treatment.  She had a right knee surgery in the past which makes it difficult for her to bend her knee.  She thinks this is why she trips frequently.  She tripped while trying to go up the steps.  She fell and degloved her anterior right leg area.  She has a large wound that includes the majority of the right leg to fat.  The superficial skin is blue and necrotic.  The underlying fat looks ok at this time.  Full exam limited by pain.  Pulses in the leg are regular.  She has multiple skin bruising likely secondary to the fall but also the steroid side effect.  Vaseline dressing being placed at this time.  Past Medical History  Diagnosis Date  . COPD (chronic obstructive pulmonary disease) ([place])   . HTN (hypertension)     History reviewed. No pertinent past surgical history.  History reviewed. No pertinent family history.  Social History:  reports that she has quit smoking. She does not have any smokeless tobacco history on file. Her alcohol and drug histories are not on file.  Allergies:  Allergies  Allergen Reactions  . Fosamax [Alendronate Sodium] Other (See Comments)    "indegestion"     Medications: I have reviewed the patient's current medications.  Results for orders placed or performed during the hospital encounter of 07/07/15 (from the past 48 hour(s))  Prepare fresh frozen plasma     Status: None   Collection Time: 07/07/15 10:50 PM  Result Value Ref Range   Unit Number [id number]    Blood Component Type LIQ PLASMA    Unit division 00    Status of Unit REL FROM [facility]    Transfusion Status OK TO TRANSFUSE    Unit tag comment VERBAL ORDERS PER DR [place]    Unit Number [id number]    Blood Component Type LIQ PLASMA    Unit  division 00    Status of Unit REL FROM [facility]    Transfusion Status OK TO TRANSFUSE    Unit tag comment VERBAL ORDERS PER DR MESNER   Type and screen     Status: None   Collection Time: 07/07/15 11:20 PM  Result Value Ref Range   ABO/RH(D) O POS    Antibody Screen NEG    Sample Expiration 07/10/2015    Unit Number [id number]    Blood Component Type RED CELLS,LR    Unit division 00    Status of Unit REL FROM [facility]    Unit tag comment VERBAL ORDERS PER DR MESNER    Transfusion Status OK TO TRANSFUSE    Crossmatch Result NOT NEEDED    Unit Number [id number]    Blood Component Type RBC CPDA1, LR    Unit division 00    Status of Unit REL FROM [facility]    Unit tag comment VERBAL ORDERS PER DR MESNER    Transfusion Status OK TO TRANSFUSE    Crossmatch Result NOT NEEDED   ABO/Rh     Status: None   Collection Time: 07/07/15 11:20 PM  Result Value Ref Range   ABO/RH(D) O POS   CDS serology     Status: None   Collection Time: 07/07/15 11:23 PM  Result Value Ref Range   CDS serology specimen      SPECIMEN WILL BE HELD FOR 14 DAYS IF TESTING IS REQUIRED  Comprehensive metabolic panel     Status: Abnormal   Collection Time: 07/07/15 11:23 PM  Result Value Ref Range   Sodium 138 135 - 145 mmol/L   Potassium 4.1 3.5 - 5.1 mmol/L   Chloride 105 101 - 111 mmol/L   CO2 20 (L) 22 - 32 mmol/L   Glucose, Bld 115 (H) 65 - 99 mg/dL   BUN 6 6 - 20 mg/dL   Creatinine, Ser 0.90 0.44 - 1.00 mg/dL   Calcium 9.1 8.9 - 10.3 mg/dL   Total Protein 6.3 (L) 6.5 - 8.1 g/dL   Albumin 3.6 3.5 - 5.0 g/dL   AST 21 15 - 41 U/L   ALT 20 14 - 54 U/L   Alkaline Phosphatase 58 38 - 126 U/L   Total Bilirubin 0.3 0.3 - 1.2 mg/dL   GFR calc non Af Amer >60 >60 mL/min   GFR calc Af Amer >60 >60 mL/min    Comment: (NOTE) The eGFR has been calculated using the CKD EPI equation. This calculation has not been validated in all clinical situations. eGFR's persistently <60 mL/min signify possible Chronic  Kidney Disease.    Anion gap 13 5 - 15  CBC     Status: None   Collection Time: 07/07/15 11:23 PM  Result Value Ref Range   WBC 8.7 4.0 - 10.5 K/uL   RBC 4.58 3.87 - 5.11 MIL/uL   Hemoglobin 13.3 12.0 - 15.0 g/dL   HCT 41.1 36.0 - 46.0 %   MCV 89.7 78.0 - 100.0 fL   MCH 29.0 26.0 - 34.0 pg   MCHC 32.4 30.0 - 36.0 g/dL   RDW 13.4 11.5 - 15.5 %   Platelets 244 150 - 400 K/uL  Ethanol     Status: None   Collection Time: 07/07/15 11:23 PM  Result Value Ref Range   Alcohol, Ethyl (B) <5 <5 mg/dL    Comment:        LOWEST DETECTABLE LIMIT FOR SERUM ALCOHOL IS 5 mg/dL FOR MEDICAL PURPOSES ONLY   Protime-INR     Status: None   Collection Time: 07/07/15 11:23 PM  Result Value Ref Range   Prothrombin Time 13.6 11.6 - 15.2 seconds   INR 1.02 0.00 - 1.49  Troponin I     Status: None   Collection Time: 07/07/15 11:23 PM  Result Value Ref Range   Troponin I <0.03 <0.031 ng/mL    Comment:        NO INDICATION OF MYOCARDIAL INJURY.   I-stat troponin, ED     Status: None   Collection Time: 07/07/15 11:50 PM  Result Value Ref Range   Troponin i, poc 0.00 0.00 - 0.08 ng/mL   Comment 3            Comment: Due to the release kinetics of cTnI, a negative result within the first hours of the onset of symptoms does not rule out myocardial infarction with certainty. If myocardial infarction is still suspected, repeat the test at appropriate intervals.   Provider-confirm verbal Blood Bank order - Type & Screen, RBC, FFP; 4 Units; Order taken: 07/07/2015; 10:50 PM; Level 1 Trauma, Emergency Release     Status: None   Collection Time: 07/08/15  3:30 PM  Result Value Ref Range   Blood product order confirm MD AUTHORIZATION REQUESTED  Dg Pelvis Portable  07/07/2015  CLINICAL DATA:  Trauma, fall downstairs. EXAM: PORTABLE PELVIS 1-2 VIEWS COMPARISON:  None. FINDINGS: The cortical margins of the bony pelvis are intact. No fracture. Pubic symphysis and sacroiliac joints are congruent. Both  femoral heads are well-seated in the respective acetabula. IMPRESSION: No evidence of pelvic fracture. Electronically Signed   By: Jeb Levering M.D.   On: 07/07/2015 23:45   Dg Chest Portable 1 View  07/07/2015  CLINICAL DATA:  Trauma.  Fall downstairs. EXAM: PORTABLE CHEST 1 VIEW COMPARISON:  None. FINDINGS: Left lateral lower hemithorax excluded from the field of view. The lungs are hyperinflated with probable emphysematous change. Heart size and mediastinal contours are normal. There is no evidence pneumothorax, large pleural effusion or focal consolidation. No evidence of displaced rib fracture. Probable intra-articular bodies about the right shoulder. IMPRESSION: Hyperinflation and probable emphysema. No evidence of acute traumatic process. Left costophrenic angle excluded from field of view. Electronically Signed   By: Jeb Levering M.D.   On: 07/07/2015 23:44   Dg Tibia/fibula Left Port  07/07/2015  CLINICAL DATA:  Trauma, fall downstairs. EXAM: PORTABLE LEFT TIBIA AND FIBULA - 2 VIEW COMPARISON:  None. FINDINGS: Cortical margins of the tibia and fibula are intact. No evidence of fracture. Knee and ankle alignment grossly maintained. There is anterior soft tissue edema. No radiopaque foreign body or soft tissue air. IMPRESSION: Anterior soft tissue edema.  No foreign body or fracture Electronically Signed   By: Jeb Levering M.D.   On: 07/07/2015 23:46   Dg Tibia/fibula Right Port  07/07/2015  CLINICAL DATA:  Trauma.  Fell downstairs. EXAM: PORTABLE RIGHT TIBIA AND FIBULA - 2 VIEW COMPARISON:  None. FINDINGS: Previous screw fixation of the proximal right tibia. There is soft tissue laceration along the anterior aspect of the lower leg. No underlying fracture or dislocation. IMPRESSION: 1. Anterior soft tissue laceration. 2. No fracture identified. Electronically Signed   By: Kerby Moors M.D.   On: 07/07/2015 23:46    Review of Systems  Constitutional: Negative.   HENT: Negative.    Eyes: Negative.   Respiratory: Negative.   Cardiovascular: Negative.   Gastrointestinal: Negative.   Genitourinary: Negative.   Musculoskeletal: Negative.   Skin: Negative.   Psychiatric/Behavioral: Negative.    Blood pressure 125/48, pulse 97, temperature 98.3 F (36.8 C), temperature source Oral, resp. rate 18, height _0  (1.6 m), weight 83.598 kg (184 lb 4.8 oz), SpO2 89 %. Physical Exam  Constitutional: She is oriented to person, place, and time. She appears well-developed and well-nourished.  HENT:  Head: Normocephalic and atraumatic.  Eyes: EOM are normal. Pupils are equal, round, and reactive to light.  Cardiovascular: Normal rate.   Respiratory: Effort normal. No respiratory distress.  GI: Soft.  Musculoskeletal:       Legs: Neurological: She is alert and oriented to person, place, and time.  Skin: There is erythema.  Psychiatric: She has a normal mood and affect. Her behavior is normal. Judgment and thought content normal.    Assessment/Plan: Recommend multivitamin daily, Vit C 500 mg daily, Zinc 220 mg daily, increase protein and wet to dry dressings daily.  Plan on OR for debridement with Acell and VAC placement to right leg wound.  Wallace Going 07/09/2015, 2:42 PM

## 2015-07-12 NOTE — Transfer of Care (Signed)
Immediate Anesthesia Transfer of Care Note  Patient: [name]  Procedure(s) Performed: Procedure(s): IRRIGATION AND DEBRIDEMENT RIGHT LEG WOUND WITH ACELL AND VAC (Right)  Patient Location: PACU  Anesthesia Type:General  Level of Consciousness: awake, alert  and oriented  Airway & Oxygen Therapy: Patient Spontanous Breathing and Patient connected to nasal cannula oxygen  Post-op Assessment: Report given to RN and Post -op Vital signs reviewed and stable  Post vital signs: Reviewed and stable  Last Vitals:  Filed Vitals:   07/12/15 1051  BP: 170/75  Pulse: 110  Temp: 36.6 C  Resp: 18    Complications: No apparent anesthesia complications

## 2015-07-12 NOTE — Interval H&P Note (Signed)
History and Physical Interval Note:  07/12/2015 12:40 PM  Meghan [name]  has presented today for surgery, with the diagnosis of RIGHT LEG WOUND  The various methods of treatment have been discussed with the patient and family. After consideration of risks, benefits and other options for treatment, the patient has consented to  Procedure(s): IRRIGATION AND DEBRIDEMENT RIGHT LEG WOUND WITH ACELL AND VAC (Right) as a surgical intervention .  The patient's history has been reviewed, patient examined, no change in status, stable for surgery.  I have reviewed the patient's chart and labs.  Questions were answered to the patient's satisfaction.     Peggye FormLAIRE S Moniqua Engebretsen

## 2015-07-12 NOTE — Discharge Instructions (Signed)
VAC at 125 mmHg pressure on right leg and change once a week. Keep leg wrapped with ace wrap and elevated.

## 2015-07-12 NOTE — Anesthesia Preprocedure Evaluation (Addendum)
Anesthesia Evaluation  Patient identified by MRN, date of birth, ID band Patient awake    Reviewed: Allergy & Precautions, NPO status , Patient's Chart, lab work & pertinent test results  Airway Mallampati: II  TM Distance: >3 FB Neck ROM: Full    Dental   Pulmonary shortness of breath, asthma , pneumonia, former smoker,    breath sounds clear to auscultation       Cardiovascular hypertension,  Rhythm:Regular Rate:Normal     Neuro/Psych    GI/Hepatic negative GI ROS, Neg liver ROS,   Endo/Other  negative endocrine ROS  Renal/GU negative Renal ROS     Musculoskeletal   Abdominal   Peds  Hematology   Anesthesia Other Findings   Reproductive/Obstetrics                            Anesthesia Physical Anesthesia Plan  ASA: IV  Anesthesia Plan: General   Post-op Pain Management:    Induction: Intravenous  Airway Management Planned: LMA  Additional Equipment:   Intra-op Plan:   Post-operative Plan: Extubation in OR and Possible Post-op intubation/ventilation  Informed Consent:   Dental advisory given  Plan Discussed with: CRNA and Anesthesiologist  Anesthesia Plan Comments:        Anesthesia Quick Evaluation

## 2015-07-12 NOTE — Anesthesia Procedure Notes (Signed)
**Note De-[name] via Obfuscation** Procedure Name: LMA Insertion Date/Time: 07/12/2015 1:14 PM [name] by: Sarita HaverFLOWERS, Meghan [name], Meghan [name], Meghan [name], Meghan [name] of attempts: 1 Airway Equipment and Method: Meghan positioned with wedge pillow Placement Confirmation: positive ETCO2 and breath sounds checked- equal and bilateral Tube secured with: Tape Dental Injury: Teeth and Oropharynx as per pre-operative assessment

## 2015-07-12 NOTE — Brief Op Note (Signed)
07/12/2015  2:02 PM  PATIENT:  Meghan [name]  65 y.o. female  PRE-OPERATIVE DIAGNOSIS:  RIGHT LEG WOUND  POST-OPERATIVE DIAGNOSIS:  RIGHT LEG WOUND  PROCEDURE:  Procedure(s): IRRIGATION AND DEBRIDEMENT RIGHT LEG WOUND WITH ACELL AND VAC (Right)  SURGEON:  Surgeon(s) and Role:    * Claudina Oliphant S Yoshi Mancillas, DO - Primary  ASSISTANTS: none   ANESTHESIA:   general  EBL:     BLOOD ADMINISTERED:none  DRAINS: none   LOCAL MEDICATIONS USED:  NONE  SPECIMEN:  No Specimen  DISPOSITION OF SPECIMEN:  N/A  COUNTS:  YES  TOURNIQUET:  * No tourniquets in log *  DICTATION: .Dragon Dictation  PLAN OF CARE: Discharge to home after PACU  PATIENT DISPOSITION:  PACU - hemodynamically stable.   Delay start of Pharmacological VTE agent (>24hrs) due to surgical blood loss or risk of bleeding: no

## 2015-07-13 DIAGNOSIS — S80811D Abrasion, right lower leg, subsequent encounter: Secondary | ICD-10-CM | POA: Diagnosis not present

## 2015-07-13 DIAGNOSIS — F17219 Nicotine dependence, cigarettes, with unspecified nicotine-induced disorders: Secondary | ICD-10-CM | POA: Diagnosis not present

## 2015-07-13 DIAGNOSIS — J449 Chronic obstructive pulmonary disease, unspecified: Secondary | ICD-10-CM | POA: Diagnosis not present

## 2015-07-13 DIAGNOSIS — W109XXD Fall (on) (from) unspecified stairs and steps, subsequent encounter: Secondary | ICD-10-CM | POA: Diagnosis not present

## 2015-07-13 DIAGNOSIS — Z4800 Encounter for change or removal of nonsurgical wound dressing: Secondary | ICD-10-CM | POA: Diagnosis not present

## 2015-07-13 DIAGNOSIS — I10 Essential (primary) hypertension: Secondary | ICD-10-CM | POA: Diagnosis not present

## 2015-07-16 DIAGNOSIS — I10 Essential (primary) hypertension: Secondary | ICD-10-CM | POA: Diagnosis not present

## 2015-07-16 DIAGNOSIS — F17219 Nicotine dependence, cigarettes, with unspecified nicotine-induced disorders: Secondary | ICD-10-CM | POA: Diagnosis not present

## 2015-07-16 DIAGNOSIS — W109XXD Fall (on) (from) unspecified stairs and steps, subsequent encounter: Secondary | ICD-10-CM | POA: Diagnosis not present

## 2015-07-16 DIAGNOSIS — J449 Chronic obstructive pulmonary disease, unspecified: Secondary | ICD-10-CM | POA: Diagnosis not present

## 2015-07-16 DIAGNOSIS — S80811D Abrasion, right lower leg, subsequent encounter: Secondary | ICD-10-CM | POA: Diagnosis not present

## 2015-07-16 DIAGNOSIS — Z4800 Encounter for change or removal of nonsurgical wound dressing: Secondary | ICD-10-CM | POA: Diagnosis not present

## 2015-07-17 ENCOUNTER — Encounter (HOSPITAL_COMMUNITY): Payer: Self-pay | Admitting: Plastic Surgery

## 2015-07-17 ENCOUNTER — Encounter (HOSPITAL_COMMUNITY): Payer: Medicare Other

## 2015-07-19 ENCOUNTER — Encounter (HOSPITAL_COMMUNITY): Payer: Medicare Other

## 2015-07-19 DIAGNOSIS — Z4800 Encounter for change or removal of nonsurgical wound dressing: Secondary | ICD-10-CM | POA: Diagnosis not present

## 2015-07-19 DIAGNOSIS — W109XXD Fall (on) (from) unspecified stairs and steps, subsequent encounter: Secondary | ICD-10-CM | POA: Diagnosis not present

## 2015-07-19 DIAGNOSIS — J449 Chronic obstructive pulmonary disease, unspecified: Secondary | ICD-10-CM | POA: Diagnosis not present

## 2015-07-19 DIAGNOSIS — S80811D Abrasion, right lower leg, subsequent encounter: Secondary | ICD-10-CM | POA: Diagnosis not present

## 2015-07-19 DIAGNOSIS — I10 Essential (primary) hypertension: Secondary | ICD-10-CM | POA: Diagnosis not present

## 2015-07-19 DIAGNOSIS — F17219 Nicotine dependence, cigarettes, with unspecified nicotine-induced disorders: Secondary | ICD-10-CM | POA: Diagnosis not present

## 2015-07-23 DIAGNOSIS — S81801D Unspecified open wound, right lower leg, subsequent encounter: Secondary | ICD-10-CM | POA: Diagnosis not present

## 2015-07-24 ENCOUNTER — Encounter (HOSPITAL_COMMUNITY): Payer: Medicare Other

## 2015-07-26 ENCOUNTER — Other Ambulatory Visit: Payer: Self-pay | Admitting: Plastic Surgery

## 2015-07-26 ENCOUNTER — Encounter (HOSPITAL_COMMUNITY): Payer: Medicare Other

## 2015-07-26 DIAGNOSIS — S81801A Unspecified open wound, right lower leg, initial encounter: Secondary | ICD-10-CM

## 2015-07-26 NOTE — H&P (Signed)
Meghan [name] is an 65 y.o. female.   Chief Complaint: right leg wound HPI: The patient is a 65 y.o. yrs old wf here for care on her right leg wound. She fell several weeks ago and sustained a large degloving to the right leg area. She was taken to the OR and underwent debridement with Acell and VAC placement. The sorbact is in place. She is granulating through the mesh. She is very sensitive. There is no sign of infection.   Past Medical History  Diagnosis Date  . Shortness of breath dyspnea     long distances and walking upstairs  . Hypertension   . Cyst of left breast 03/31/1993    benign  . H/O right knee surgery 11/30/2003  . COPD (chronic obstructive pulmonary disease) ([facility])   . HTN (hypertension)   . Pneumonia     Past Surgical History  Procedure Laterality Date  . Breast surgery  1986    benign breat bx  . Tubal ligation  1980s  . Incision and drainage of wound Right 07/12/2015    Procedure: IRRIGATION AND DEBRIDEMENT RIGHT LEG WOUND WITH ACELL AND VAC;  Surgeon: Alena Billslaire S Dillingham, DO;  Location: [facility] OR;  Service: Plastics;  Laterality: Right;    Family History  Problem Relation Age of Onset  . Emphysema Father    Social History:  reports that she quit smoking about 9 years ago. Her smoking use included Cigarettes. She has a 52.5 pack-year smoking history. She has never used smokeless tobacco. She reports that she does not drink alcohol or use illicit drugs.  Allergies:  Allergies  Allergen Reactions  . Fosamax [Alendronate Sodium] Other (See Comments)    "indegestion"      (Not in a hospital admission)  No results found for this or any previous visit (from the past 48 hour(s)). No results found.  Review of Systems  Constitutional: Negative.   HENT: Negative.   Eyes: Negative.   Respiratory: Negative.   Cardiovascular: Negative.   Gastrointestinal: Negative.   Genitourinary: Negative.   Musculoskeletal: Positive for joint pain.  Skin: Negative.     Psychiatric/Behavioral: Negative.     There were no vitals taken for this visit. Physical Exam  Constitutional: She is oriented to person, place, and time. She appears well-developed and well-nourished.  HENT:  Head: Normocephalic and atraumatic.  Eyes: Conjunctivae and EOM are normal. Pupils are equal, round, and reactive to light.  Cardiovascular: Normal rate.   Respiratory: Effort normal.  GI: Soft. She exhibits no distension.  Musculoskeletal:       Legs: Neurological: She is alert and oriented to person, place, and time.  Skin: Skin is warm.  Psychiatric: She has a normal mood and affect. Her behavior is normal. Judgment and thought content normal.     Assessment/Plan Plan for debridement of right leg with Acell / VAC and possible skin graft.  Peggye FormCLAIRE S DILLINGHAM, DO 07/26/2015, 7:58 AM

## 2015-07-27 DIAGNOSIS — F17219 Nicotine dependence, cigarettes, with unspecified nicotine-induced disorders: Secondary | ICD-10-CM | POA: Diagnosis not present

## 2015-07-27 DIAGNOSIS — Z4800 Encounter for change or removal of nonsurgical wound dressing: Secondary | ICD-10-CM | POA: Diagnosis not present

## 2015-07-27 DIAGNOSIS — W109XXD Fall (on) (from) unspecified stairs and steps, subsequent encounter: Secondary | ICD-10-CM | POA: Diagnosis not present

## 2015-07-27 DIAGNOSIS — S80811D Abrasion, right lower leg, subsequent encounter: Secondary | ICD-10-CM | POA: Diagnosis not present

## 2015-07-27 DIAGNOSIS — I10 Essential (primary) hypertension: Secondary | ICD-10-CM | POA: Diagnosis not present

## 2015-07-27 DIAGNOSIS — J449 Chronic obstructive pulmonary disease, unspecified: Secondary | ICD-10-CM | POA: Diagnosis not present

## 2015-07-30 DIAGNOSIS — Z4800 Encounter for change or removal of nonsurgical wound dressing: Secondary | ICD-10-CM | POA: Diagnosis not present

## 2015-07-30 DIAGNOSIS — J449 Chronic obstructive pulmonary disease, unspecified: Secondary | ICD-10-CM | POA: Diagnosis not present

## 2015-07-30 DIAGNOSIS — S80811D Abrasion, right lower leg, subsequent encounter: Secondary | ICD-10-CM | POA: Diagnosis not present

## 2015-07-30 DIAGNOSIS — W109XXD Fall (on) (from) unspecified stairs and steps, subsequent encounter: Secondary | ICD-10-CM | POA: Diagnosis not present

## 2015-07-30 DIAGNOSIS — I10 Essential (primary) hypertension: Secondary | ICD-10-CM | POA: Diagnosis not present

## 2015-07-30 DIAGNOSIS — F17219 Nicotine dependence, cigarettes, with unspecified nicotine-induced disorders: Secondary | ICD-10-CM | POA: Diagnosis not present

## 2015-07-31 ENCOUNTER — Encounter (HOSPITAL_COMMUNITY): Payer: Medicare Other

## 2015-07-31 ENCOUNTER — Encounter (HOSPITAL_COMMUNITY): Payer: Self-pay | Admitting: *Deleted

## 2015-07-31 NOTE — Anesthesia Preprocedure Evaluation (Addendum)
Anesthesia Evaluation  Patient identified by MRN, date of birth, ID band Patient awake    Reviewed: Allergy & Precautions, NPO status , Patient's Chart, lab work & pertinent test results  Airway Mallampati: II  TM Distance: >3 FB Neck ROM: Full    Dental  (+) Dental Advisory Given, Missing, Partial Upper   Pulmonary asthma , COPD,  COPD inhaler, former smoker,    Pulmonary exam normal breath sounds clear to auscultation       Cardiovascular hypertension, Pt. on medications (-) angina(-) Past MI Normal cardiovascular exam Rhythm:Regular Rate:Normal     Neuro/Psych negative neurological ROS  negative psych ROS   GI/Hepatic negative GI ROS, Neg liver ROS,   Endo/Other  Obesity   Renal/GU negative Renal ROS     Musculoskeletal Right leg wound   Abdominal   Peds  Hematology negative hematology ROS (+)   Anesthesia Other Findings   Reproductive/Obstetrics                            Anesthesia Physical Anesthesia Plan  ASA: III  Anesthesia Plan: General   Post-op Pain Management:    Induction: Intravenous  Airway Management Planned: LMA  Additional Equipment:   Intra-op Plan:   Post-operative Plan: Extubation in OR  Informed Consent: I have reviewed the patients History and Physical, chart, labs and discussed the procedure including the risks, benefits and alternatives for the proposed anesthesia with the patient or authorized representative who has indicated his/her understanding and acceptance.   Dental advisory given  Plan Discussed with: CRNA  Anesthesia Plan Comments: (Risks/benefits of general anesthesia discussed with patient including risk of damage to teeth, lips, gum, and tongue, nausea/vomiting, allergic reactions to medications, and the possibility of heart attack, stroke and death.  All patient questions answered.  Patient wishes to proceed.)       Anesthesia  Quick Evaluation

## 2015-07-31 NOTE — Progress Notes (Signed)
Pt denies any changes to medical and surgical history since she was here 2 weeks ago. Denies any recent chest pain or sob.

## 2015-08-01 ENCOUNTER — Ambulatory Visit (HOSPITAL_COMMUNITY): Payer: Medicare Other | Admitting: Anesthesiology

## 2015-08-01 ENCOUNTER — Encounter (HOSPITAL_COMMUNITY): Payer: Self-pay | Admitting: Plastic Surgery

## 2015-08-01 ENCOUNTER — Encounter (HOSPITAL_COMMUNITY)
Admission: RE | Disposition: A | Discharge status: Home / Self Care | Payer: Self-pay | Source: Ambulatory Visit | Attending: Plastic Surgery

## 2015-08-01 ENCOUNTER — Ambulatory Visit (HOSPITAL_COMMUNITY)
Admission: RE | Admit: 2015-08-01 | Discharge: 2015-08-01 | Disposition: A | Discharge status: Home / Self Care | Payer: Medicare Other | Source: Ambulatory Visit | Attending: Plastic Surgery | Admitting: Plastic Surgery

## 2015-08-01 DIAGNOSIS — S81801A Unspecified open wound, right lower leg, initial encounter: Secondary | ICD-10-CM | POA: Diagnosis not present

## 2015-08-01 DIAGNOSIS — Z87891 Personal history of nicotine dependence: Secondary | ICD-10-CM | POA: Insufficient documentation

## 2015-08-01 DIAGNOSIS — E669 Obesity, unspecified: Secondary | ICD-10-CM | POA: Insufficient documentation

## 2015-08-01 DIAGNOSIS — Z6833 Body mass index (BMI) 33.0-33.9, adult: Secondary | ICD-10-CM | POA: Diagnosis not present

## 2015-08-01 DIAGNOSIS — I10 Essential (primary) hypertension: Secondary | ICD-10-CM | POA: Insufficient documentation

## 2015-08-01 DIAGNOSIS — J449 Chronic obstructive pulmonary disease, unspecified: Secondary | ICD-10-CM | POA: Insufficient documentation

## 2015-08-01 DIAGNOSIS — S81801D Unspecified open wound, right lower leg, subsequent encounter: Secondary | ICD-10-CM | POA: Diagnosis not present

## 2015-08-01 HISTORY — PX: INCISION AND DRAINAGE OF WOUND: SHX1803

## 2015-08-01 LAB — BASIC METABOLIC PANEL
ANION GAP: 10 (ref 5–15)
BUN: 12 mg/dL (ref 6–20)
CALCIUM: 9 mg/dL (ref 8.9–10.3)
CO2: 21 mmol/L — ABNORMAL LOW (ref 22–32)
Chloride: 101 mmol/L (ref 101–111)
Creatinine, Ser: 0.78 mg/dL (ref 0.44–1.00)
Glucose, Bld: 125 mg/dL — ABNORMAL HIGH (ref 65–99)
Potassium: 5.1 mmol/L (ref 3.5–5.1)
Sodium: 132 mmol/L — ABNORMAL LOW (ref 135–145)

## 2015-08-01 LAB — CBC
HCT: 37.9 % (ref 36.0–46.0)
HEMOGLOBIN: 12.6 g/dL (ref 12.0–15.0)
MCH: 28.6 pg (ref 26.0–34.0)
MCHC: 33.2 g/dL (ref 30.0–36.0)
MCV: 85.9 fL (ref 78.0–100.0)
PLATELETS: 265 10*3/uL (ref 150–400)
RBC: 4.41 MIL/uL (ref 3.87–5.11)
RDW: 14.1 % (ref 11.5–15.5)
WBC: 7.4 10*3/uL (ref 4.0–10.5)

## 2015-08-01 SURGERY — IRRIGATION AND DEBRIDEMENT WOUND
Anesthesia: General | Laterality: Right

## 2015-08-01 MED ORDER — ALBUTEROL SULFATE HFA 108 (90 BASE) MCG/ACT IN AERS
INHALATION_SPRAY | RESPIRATORY_TRACT | Status: DC | PRN
  Administered 2015-08-01 (×2): 4 via RESPIRATORY_TRACT

## 2015-08-01 MED ORDER — LACTATED RINGERS IV SOLN
INTRAVENOUS | Status: DC
  Administered 2015-08-01 (×2): via INTRAVENOUS

## 2015-08-01 MED ORDER — PROPOFOL 10 MG/ML IV BOLUS
INTRAVENOUS | Status: AC
  Filled 2015-08-01: qty 20

## 2015-08-01 MED ORDER — ONDANSETRON HCL 4 MG/2ML IJ SOLN
INTRAMUSCULAR | Status: DC | PRN
  Administered 2015-08-01: 4 mg via INTRAVENOUS

## 2015-08-01 MED ORDER — PROPOFOL 10 MG/ML IV BOLUS
INTRAVENOUS | Status: DC | PRN
  Administered 2015-08-01: 150 mg via INTRAVENOUS
  Administered 2015-08-01: 50 mg via INTRAVENOUS

## 2015-08-01 MED ORDER — LIDOCAINE HCL (CARDIAC) 20 MG/ML IV SOLN
INTRAVENOUS | Status: DC | PRN
  Administered 2015-08-01: 60 mg via INTRAVENOUS

## 2015-08-01 MED ORDER — FENTANYL CITRATE (PF) 100 MCG/2ML IJ SOLN
INTRAMUSCULAR | Status: DC | PRN
  Administered 2015-08-01 (×2): 50 ug via INTRAVENOUS

## 2015-08-01 MED ORDER — SODIUM CHLORIDE 0.9 % IR SOLN
Status: DC | PRN
  Administered 2015-08-01: 500 mL

## 2015-08-01 MED ORDER — ROCURONIUM BROMIDE 50 MG/5ML IV SOLN
INTRAVENOUS | Status: AC
  Filled 2015-08-01: qty 1

## 2015-08-01 MED ORDER — PHENYLEPHRINE 40 MCG/ML (10ML) SYRINGE FOR IV PUSH (FOR BLOOD PRESSURE SUPPORT)
PREFILLED_SYRINGE | INTRAVENOUS | Status: AC
  Filled 2015-08-01: qty 10

## 2015-08-01 MED ORDER — ONDANSETRON HCL 4 MG/2ML IJ SOLN
INTRAMUSCULAR | Status: AC
  Filled 2015-08-01: qty 2

## 2015-08-01 MED ORDER — SUCCINYLCHOLINE CHLORIDE 200 MG/10ML IV SOSY
PREFILLED_SYRINGE | INTRAVENOUS | Status: AC
  Filled 2015-08-01: qty 10

## 2015-08-01 MED ORDER — SODIUM CHLORIDE 0.9 % IR SOLN
Status: DC | PRN
  Administered 2015-08-01: 1000 mL

## 2015-08-01 MED ORDER — ALBUTEROL SULFATE (2.5 MG/3ML) 0.083% IN NEBU
2.5000 mg | INHALATION_SOLUTION | Freq: Once | RESPIRATORY_TRACT | Status: AC
  Administered 2015-08-01: 2.5 mg via RESPIRATORY_TRACT

## 2015-08-01 MED ORDER — PHENYLEPHRINE HCL 10 MG/ML IJ SOLN
INTRAMUSCULAR | Status: DC | PRN
  Administered 2015-08-01: 160 ug via INTRAVENOUS
  Administered 2015-08-01 (×3): 80 ug via INTRAVENOUS

## 2015-08-01 MED ORDER — ALBUTEROL SULFATE (2.5 MG/3ML) 0.083% IN NEBU
INHALATION_SOLUTION | RESPIRATORY_TRACT | Status: AC
  Filled 2015-08-01: qty 3

## 2015-08-01 MED ORDER — SUCCINYLCHOLINE CHLORIDE 20 MG/ML IJ SOLN
INTRAMUSCULAR | Status: DC | PRN
  Administered 2015-08-01: 100 mg via INTRAVENOUS

## 2015-08-01 MED ORDER — EPHEDRINE 5 MG/ML INJ
INTRAVENOUS | Status: AC
  Filled 2015-08-01: qty 10

## 2015-08-01 MED ORDER — FENTANYL CITRATE (PF) 250 MCG/5ML IJ SOLN
INTRAMUSCULAR | Status: AC
  Filled 2015-08-01: qty 5

## 2015-08-01 MED ORDER — MIDAZOLAM HCL 5 MG/5ML IJ SOLN
INTRAMUSCULAR | Status: DC | PRN
  Administered 2015-08-01: 2 mg via INTRAVENOUS

## 2015-08-01 MED ORDER — FENTANYL CITRATE (PF) 100 MCG/2ML IJ SOLN: 25.0000 ug | INTRAMUSCULAR | Status: DC | PRN

## 2015-08-01 MED ORDER — ALBUTEROL SULFATE HFA 108 (90 BASE) MCG/ACT IN AERS
INHALATION_SPRAY | RESPIRATORY_TRACT | Status: AC
  Filled 2015-08-01: qty 6.7

## 2015-08-01 MED ORDER — ONDANSETRON HCL 4 MG/2ML IJ SOLN: 4.0000 mg | Freq: Once | INTRAMUSCULAR | Status: DC | PRN

## 2015-08-01 MED ORDER — EPHEDRINE SULFATE 50 MG/ML IJ SOLN
INTRAMUSCULAR | Status: DC | PRN
  Administered 2015-08-01 (×2): 10 mg via INTRAVENOUS

## 2015-08-01 MED ORDER — CEFAZOLIN SODIUM-DEXTROSE 2-4 GM/100ML-% IV SOLN
2.0000 g | INTRAVENOUS | Status: AC
  Administered 2015-08-01: 2 g via INTRAVENOUS
  Filled 2015-08-01: qty 100

## 2015-08-01 MED ORDER — MIDAZOLAM HCL 2 MG/2ML IJ SOLN
INTRAMUSCULAR | Status: AC
  Filled 2015-08-01: qty 2

## 2015-08-01 SURGICAL SUPPLY — 47 items
BAG DECANTER FOR FLEXI CONT (MISCELLANEOUS) IMPLANT
BANDAGE ELASTIC 4 VELCRO ST LF (GAUZE/BANDAGES/DRESSINGS) ×3 IMPLANT
BENZOIN TINCTURE PRP APPL 2/3 (GAUZE/BANDAGES/DRESSINGS) ×3 IMPLANT
BNDG GAUZE ELAST 4 BULKY (GAUZE/BANDAGES/DRESSINGS) ×6 IMPLANT
CANISTER SUCTION 2500CC (MISCELLANEOUS) ×3 IMPLANT
CONT SPEC STER OR (MISCELLANEOUS) IMPLANT
COVER SURGICAL LIGHT HANDLE (MISCELLANEOUS) ×3 IMPLANT
DRAPE IMP U-DRAPE 54X76 (DRAPES) ×3 IMPLANT
DRAPE INCISE IOBAN 66X45 STRL (DRAPES) IMPLANT
DRAPE LAPAROSCOPIC ABDOMINAL (DRAPES) IMPLANT
DRAPE PED LAPAROTOMY (DRAPES) ×3 IMPLANT
DRAPE PROXIMA HALF (DRAPES) IMPLANT
DRESSING HYDROCOLLOID 4X4 (GAUZE/BANDAGES/DRESSINGS) IMPLANT
DRESSING HYDROCOLLOID 4X4 XTH (GAUZE/BANDAGES/DRESSINGS) ×3 IMPLANT
DRSG ADAPTIC 3X8 NADH LF (GAUZE/BANDAGES/DRESSINGS) IMPLANT
DRSG CUTIMED SORBACT 7X9 (GAUZE/BANDAGES/DRESSINGS) ×3 IMPLANT
DRSG PAD ABDOMINAL 8X10 ST (GAUZE/BANDAGES/DRESSINGS) IMPLANT
DRSG VAC ATS LRG SENSATRAC (GAUZE/BANDAGES/DRESSINGS) IMPLANT
DRSG VAC ATS MED SENSATRAC (GAUZE/BANDAGES/DRESSINGS) IMPLANT
DRSG VAC ATS SM SENSATRAC (GAUZE/BANDAGES/DRESSINGS) IMPLANT
ELECT CAUTERY BLADE 6.4 (BLADE) IMPLANT
ELECT REM PT RETURN 9FT ADLT (ELECTROSURGICAL) ×3
ELECTRODE REM PT RTRN 9FT ADLT (ELECTROSURGICAL) ×1 IMPLANT
GAUZE SPONGE 4X4 12PLY STRL (GAUZE/BANDAGES/DRESSINGS) IMPLANT
GLOVE BIO SURGEON STRL SZ 6.5 (GLOVE) ×2 IMPLANT
GLOVE BIO SURGEONS STRL SZ 6.5 (GLOVE) ×1
GOWN STRL REUS W/ TWL LRG LVL3 (GOWN DISPOSABLE) ×3 IMPLANT
GOWN STRL REUS W/TWL LRG LVL3 (GOWN DISPOSABLE) ×6
KIT BASIN OR (CUSTOM PROCEDURE TRAY) ×3 IMPLANT
KIT ROOM TURNOVER OR (KITS) ×3 IMPLANT
MATRIX SURGICAL PSM 10X15CM (Tissue) ×3 IMPLANT
MICROMATRIX 1000MG (Tissue) ×3 IMPLANT
MICROMATRIX 500MG (Tissue) ×6 IMPLANT
NS IRRIG 1000ML POUR BTL (IV SOLUTION) ×3 IMPLANT
PACK GENERAL/GYN (CUSTOM PROCEDURE TRAY) ×3 IMPLANT
PACK UNIVERSAL I (CUSTOM PROCEDURE TRAY) ×3 IMPLANT
PAD ARMBOARD 7.5X6 YLW CONV (MISCELLANEOUS) ×6 IMPLANT
SOLUTION PARTIC MCRMTRX 1000MG (Tissue) ×1 IMPLANT
SOLUTION PARTIC MCRMTRX 500MG (Tissue) ×2 IMPLANT
STAPLER VISISTAT 35W (STAPLE) ×3 IMPLANT
SURGILUBE 2OZ TUBE FLIPTOP (MISCELLANEOUS) IMPLANT
SUT SILK 4 0 P 3 (SUTURE) ×6 IMPLANT
SUT VIC AB 5-0 PS2 18 (SUTURE) ×9 IMPLANT
SWAB COLLECTION DEVICE MRSA (MISCELLANEOUS) IMPLANT
TOWEL OR 17X26 10 PK STRL BLUE (TOWEL DISPOSABLE) ×3 IMPLANT
TUBE ANAEROBIC SPECIMEN COL (MISCELLANEOUS) IMPLANT
UNDERPAD 30X30 INCONTINENT (UNDERPADS AND DIAPERS) ×3 IMPLANT

## 2015-08-01 NOTE — Brief Op Note (Signed)
08/01/2015  11:53 AM  PATIENT:  Meghan LernerLanice Martorano  65 y.o. female  PRE-OPERATIVE DIAGNOSIS:  right leg wound  POST-OPERATIVE DIAGNOSIS:  right leg wound  PROCEDURE:  Procedure(s): IRRIGATION AND DEBRIDEMENT RIGHT LEG WOUND WITH PLACEMENT OF A CELL AND VAC (Right)  SURGEON:  Surgeon(s) and Role:    * Sasuke Yaffe S Copeland Neisen, DO - Primary  PHYSICIAN ASSISTANT: Shawn Rayburn, PA  ASSISTANTS: none   ANESTHESIA:   general  EBL:  Total I/O In: 1200 [I.V.:1200] Out: -   BLOOD ADMINISTERED:none  DRAINS: none   LOCAL MEDICATIONS USED:  NONE  SPECIMEN:  No Specimen  DISPOSITION OF SPECIMEN:  N/A  COUNTS:  YES  TOURNIQUET:  * No tourniquets in log *  DICTATION: .Dragon Dictation  PLAN OF CARE: Discharge to home after PACU  PATIENT DISPOSITION:  PACU - hemodynamically stable.   Delay start of Pharmacological VTE agent (>24hrs) due to surgical blood loss or risk of bleeding: no

## 2015-08-01 NOTE — Op Note (Signed)
Operative Note   DATE OF OPERATION: 08/01/2015  [facility]  SURGICAL DIVISION: Plastic Surgery  PREOPERATIVE DIAGNOSES:  Right leg wound 10 x 15 cm  POSTOPERATIVE DIAGNOSES:  same  PROCEDURE:  Preparation of right leg wound for placement of Acell (powder 2 gm and sheet 10 x 15 cm), placement of VAC dressing  SURGEON: Aeon Kessner Sanger Tarina Volk, DO  ASSISTANT: Shawn Rayburn, PA  ANESTHESIA:  General.   COMPLICATIONS: None.   INDICATIONS FOR PROCEDURE:  The patient, Meghan [name] is a 65 y.o. female born on 1950/05/08, is here for treatment of a right leg wound that occurred several weeks ago when she fell up the steps and sustained a degloving injury.  She underwent debridement two weeks ago with Acell and VAC placement.  She is showing great signs of improvement with granulation tissue formation. MRN: [id number]  CONSENT:  Informed consent was obtained directly from the patient. Risks, benefits and alternatives were fully discussed. Specific risks including but not limited to bleeding, infection, hematoma, seroma, scarring, pain, infection, contracture, asymmetry, wound healing problems, and need for further surgery were all discussed. The patient did have an ample opportunity to have questions answered to satisfaction.   DESCRIPTION OF PROCEDURE:  The patient was taken to the operating room. SCD was placed on the left leg and IV antibiotics were given. The patient's operative site was prepped and draped in a sterile fashion. A time out was performed and all information was confirmed to be correct.  General anesthesia was administered.  The antibiotic solution and saline were used to irrigate the wound of the right leg.  A curette was used to debride the base of the wound 10 x 15 cm.  Hemostasis was achieved with pressure.  The Acell powder (2 gm) and sheet (10 x 15 cm) were applied and secured with 5-0 Vicryl.  The sorbact was sutured over the Acell with 4-0  sllk.  KY gel and the VAC were applied and there was an excellent seal. The leg was wrapped with kerlex and an ace wrap. The patient tolerated the procedure well.  There were no complications. The patient was allowed to wake from anesthesia, extubated and taken to the recovery room in satisfactory condition.

## 2015-08-01 NOTE — Transfer of Care (Signed)
Immediate Anesthesia Transfer of Care Note  Patient: Meghan [name]  Procedure(s) Performed: Procedure(s): IRRIGATION AND DEBRIDEMENT RIGHT LEG WOUND WITH PLACEMENT OF A CELL AND VAC (Right)  Patient Location: PACU  Anesthesia Type:General  Level of Consciousness: awake, alert  and oriented  Airway & Oxygen Therapy: Patient Spontanous Breathing and Patient connected to nasal cannula oxygen  Post-op Assessment: Report given to RN, Post -op Vital signs reviewed and stable and Patient moving all extremities X 4  Post vital signs: Reviewed and stable  Last Vitals:  Filed Vitals:   08/01/15 0812  BP: 163/61  Pulse: 99  Temp: 36.6 C  Resp: 18    Last Pain: There were no vitals filed for this visit.    Patients Stated Pain Goal: 3 (08/01/15 [id number])  Complications: No apparent anesthesia complications

## 2015-08-01 NOTE — Discharge Instructions (Signed)
VAC change in one week Change cannister when home

## 2015-08-01 NOTE — Anesthesia Procedure Notes (Signed)
Procedure Name: Intubation Date/Time: 08/01/2015 11:17 AM Performed by: Orvilla FusATO, Ludwin Flahive A Pre-anesthesia Checklist: Patient identified, Timeout performed, Emergency Drugs available, Suction available and Patient being monitored Patient Re-evaluated:Patient Re-evaluated prior to inductionOxygen Delivery Method: Circle system utilized Preoxygenation: Pre-oxygenation with 100% oxygen Intubation Type: IV induction Ventilation: Mask ventilation without difficulty Laryngoscope Size: Mac and 3 Grade View: Grade I Tube type: Oral Tube size: 7.5 mm Number of attempts: 1 Airway Equipment and Method: Stylet Placement Confirmation: ETT inserted through vocal cords under direct vision,  positive ETCO2 and breath sounds checked- equal and bilateral Secured at: 22 cm Tube secured with: Tape Dental Injury: Teeth and Oropharynx as per pre-operative assessment  Comments: LMA #4 attempted, + leak and would not seal properly. #5 LMA inserted without difficulty, again leaking. DLX1 and OETT inserted without issue. VSS

## 2015-08-01 NOTE — H&P (View-Only) (Signed)
Meghan [name] is an 65 y.o. female.   Chief Complaint: right leg wound HPI: The patient is a 65 y.o. yrs old wf here for care on her right leg wound. She fell several weeks ago and sustained a large degloving to the right leg area. She was taken to the OR and underwent debridement with Acell and VAC placement. The sorbact is in place. She is granulating through the mesh. She is very sensitive. There is no sign of infection.   Past Medical History  Diagnosis Date  . Shortness of breath dyspnea     long distances and walking upstairs  . Hypertension   . Cyst of left breast 03/31/1993    benign  . H/O right knee surgery 11/30/2003  . COPD (chronic obstructive pulmonary disease) (HCC)   . HTN (hypertension)   . Pneumonia     Past Surgical History  Procedure Laterality Date  . Breast surgery  1986    benign breat bx  . Tubal ligation  1980s  . Incision and drainage of wound Right 07/12/2015    Procedure: IRRIGATION AND DEBRIDEMENT RIGHT LEG WOUND WITH ACELL AND VAC;  Surgeon: Alena Billslaire S Dillingham, DO;  Location: [facility] OR;  Service: Plastics;  Laterality: Right;    Family History  Problem Relation Age of Onset  . Emphysema Father    Social History:  reports that she quit smoking about 9 years ago. Her smoking use included Cigarettes. She has a 52.5 pack-year smoking history. She has never used smokeless tobacco. She reports that she does not drink alcohol or use illicit drugs.  Allergies:  Allergies  Allergen Reactions  . Fosamax [Alendronate Sodium] Other (See Comments)    "indegestion"      (Not in a hospital admission)  No results found for this or any previous visit (from the past 48 hour(s)). No results found.  Review of Systems  Constitutional: Negative.   HENT: Negative.   Eyes: Negative.   Respiratory: Negative.   Cardiovascular: Negative.   Gastrointestinal: Negative.   Genitourinary: Negative.   Musculoskeletal: Positive for joint pain.  Skin: Negative.     Psychiatric/Behavioral: Negative.     There were no vitals taken for this visit. Physical Exam  Constitutional: She is oriented to person, place, and time. She appears well-developed and well-nourished.  HENT:  Head: Normocephalic and atraumatic.  Eyes: Conjunctivae and EOM are normal. Pupils are equal, round, and reactive to light.  Cardiovascular: Normal rate.   Respiratory: Effort normal.  GI: Soft. She exhibits no distension.  Musculoskeletal:       Legs: Neurological: She is alert and oriented to person, place, and time.  Skin: Skin is warm.  Psychiatric: She has a normal mood and affect. Her behavior is normal. Judgment and thought content normal.     Assessment/Plan Plan for debridement of right leg with Acell / VAC and possible skin graft.  Peggye FormCLAIRE S DILLINGHAM, DO 07/26/2015, 7:58 AM

## 2015-08-01 NOTE — Interval H&P Note (Signed)
History and Physical Interval Note:  08/01/2015 8:07 AM  Meghan LernerLanice [name]  has presented today for surgery, with the diagnosis of right leg wound  The various methods of treatment have been discussed with the patient and family. After consideration of risks, benefits and other options for treatment, the patient has consented to  Procedure(s): IRRIGATION AND DEBRIDEMENT RIGHT LEG WOUND WITH PLACEMENT OF A CELL AND VAC (Right) as a surgical intervention .  The patient's history has been reviewed, patient examined, no change in status, stable for surgery.  I have reviewed the patient's chart and labs.  Questions were answered to the patient's satisfaction.     Peggye FormLAIRE S Gricelda Foland

## 2015-08-02 ENCOUNTER — Encounter (HOSPITAL_COMMUNITY): Payer: Self-pay | Admitting: Plastic Surgery

## 2015-08-02 ENCOUNTER — Encounter (HOSPITAL_COMMUNITY): Payer: Medicare Other

## 2015-08-02 DIAGNOSIS — R0789 Other chest pain: Secondary | ICD-10-CM | POA: Diagnosis not present

## 2015-08-02 DIAGNOSIS — Z6833 Body mass index (BMI) 33.0-33.9, adult: Secondary | ICD-10-CM | POA: Diagnosis not present

## 2015-08-02 DIAGNOSIS — I10 Essential (primary) hypertension: Secondary | ICD-10-CM | POA: Diagnosis not present

## 2015-08-02 DIAGNOSIS — J449 Chronic obstructive pulmonary disease, unspecified: Secondary | ICD-10-CM | POA: Diagnosis not present

## 2015-08-02 DIAGNOSIS — R05 Cough: Secondary | ICD-10-CM | POA: Diagnosis not present

## 2015-08-02 NOTE — Anesthesia Postprocedure Evaluation (Signed)
Anesthesia Post Note  Patient: Meghan [name]  Procedure(s) Performed: Procedure(s) (LRB): IRRIGATION AND DEBRIDEMENT RIGHT LEG WOUND WITH PLACEMENT OF A CELL AND VAC (Right)  Patient location during evaluation: PACU Anesthesia Type: General Level of consciousness: awake and alert Pain management: pain level controlled Vital Signs Assessment: post-procedure vital signs reviewed and stable Respiratory status: spontaneous breathing, nonlabored ventilation, respiratory function stable and patient connected to nasal cannula oxygen Cardiovascular status: blood pressure returned to baseline and stable Postop Assessment: no signs of nausea or vomiting Anesthetic complications: no    Last Vitals:  Filed Vitals:   08/01/15 1426 08/01/15 1428  BP: 150/67   Pulse: 100 96  Temp:    Resp:  20    Last Pain: There were no vitals filed for this visit.               Cecile HearingStephen Edward Keir Viernes

## 2015-08-06 DIAGNOSIS — S81801D Unspecified open wound, right lower leg, subsequent encounter: Secondary | ICD-10-CM | POA: Diagnosis not present

## 2015-08-07 ENCOUNTER — Encounter (HOSPITAL_COMMUNITY): Payer: Medicare Other

## 2015-08-09 ENCOUNTER — Encounter (HOSPITAL_COMMUNITY): Payer: Medicare Other

## 2015-08-10 DIAGNOSIS — W109XXD Fall (on) (from) unspecified stairs and steps, subsequent encounter: Secondary | ICD-10-CM | POA: Diagnosis not present

## 2015-08-10 DIAGNOSIS — J449 Chronic obstructive pulmonary disease, unspecified: Secondary | ICD-10-CM | POA: Diagnosis not present

## 2015-08-10 DIAGNOSIS — S80811D Abrasion, right lower leg, subsequent encounter: Secondary | ICD-10-CM | POA: Diagnosis not present

## 2015-08-10 DIAGNOSIS — I10 Essential (primary) hypertension: Secondary | ICD-10-CM | POA: Diagnosis not present

## 2015-08-10 DIAGNOSIS — F17219 Nicotine dependence, cigarettes, with unspecified nicotine-induced disorders: Secondary | ICD-10-CM | POA: Diagnosis not present

## 2015-08-10 DIAGNOSIS — Z4800 Encounter for change or removal of nonsurgical wound dressing: Secondary | ICD-10-CM | POA: Diagnosis not present

## 2015-08-14 ENCOUNTER — Encounter (HOSPITAL_COMMUNITY): Payer: Medicare Other

## 2015-08-14 ENCOUNTER — Encounter (HOSPITAL_COMMUNITY): Payer: Self-pay | Admitting: *Deleted

## 2015-08-14 ENCOUNTER — Encounter (HOSPITAL_COMMUNITY): Payer: Self-pay | Admitting: Vascular Surgery

## 2015-08-14 DIAGNOSIS — S80811D Abrasion, right lower leg, subsequent encounter: Secondary | ICD-10-CM | POA: Diagnosis not present

## 2015-08-14 DIAGNOSIS — W109XXD Fall (on) (from) unspecified stairs and steps, subsequent encounter: Secondary | ICD-10-CM | POA: Diagnosis not present

## 2015-08-14 DIAGNOSIS — J449 Chronic obstructive pulmonary disease, unspecified: Secondary | ICD-10-CM | POA: Diagnosis not present

## 2015-08-14 DIAGNOSIS — F17219 Nicotine dependence, cigarettes, with unspecified nicotine-induced disorders: Secondary | ICD-10-CM | POA: Diagnosis not present

## 2015-08-14 DIAGNOSIS — Z4800 Encounter for change or removal of nonsurgical wound dressing: Secondary | ICD-10-CM | POA: Diagnosis not present

## 2015-08-14 DIAGNOSIS — I10 Essential (primary) hypertension: Secondary | ICD-10-CM | POA: Diagnosis not present

## 2015-08-14 NOTE — Progress Notes (Signed)
Spoke with pt for pre-op call. She verifies that nothing has changed with her allergies, medical and surgical history. She is no longer taking Tramadol, only using Percocet has needed. She has requested that whichever anesthesiologist worked with her first surgery on 07/12/15 is who she would like to have again. She states that the day after her 2nd surgery on 08/01/15 she had to go to her PCP because her bronchial tubes were burning so bad. She states that did not happen on her first surgery. I did tell pt that I could put in that request for her, but I didn't know if that particular anesthesiologist is working Thursday. She voiced understanding.

## 2015-08-15 DIAGNOSIS — J449 Chronic obstructive pulmonary disease, unspecified: Secondary | ICD-10-CM | POA: Diagnosis not present

## 2015-08-15 DIAGNOSIS — Z4800 Encounter for change or removal of nonsurgical wound dressing: Secondary | ICD-10-CM | POA: Diagnosis not present

## 2015-08-15 DIAGNOSIS — F17219 Nicotine dependence, cigarettes, with unspecified nicotine-induced disorders: Secondary | ICD-10-CM | POA: Diagnosis not present

## 2015-08-15 DIAGNOSIS — S80811D Abrasion, right lower leg, subsequent encounter: Secondary | ICD-10-CM | POA: Diagnosis not present

## 2015-08-15 DIAGNOSIS — W109XXD Fall (on) (from) unspecified stairs and steps, subsequent encounter: Secondary | ICD-10-CM | POA: Diagnosis not present

## 2015-08-15 DIAGNOSIS — I10 Essential (primary) hypertension: Secondary | ICD-10-CM | POA: Diagnosis not present

## 2015-08-15 NOTE — Anesthesia Preprocedure Evaluation (Deleted)
Anesthesia Evaluation    Airway        Dental   Pulmonary former smoker,           Cardiovascular hypertension,      Neuro/Psych    GI/Hepatic   Endo/Other    Renal/GU      Musculoskeletal   Abdominal   Peds  Hematology   Anesthesia Other Findings   Reproductive/Obstetrics                             Anesthesia Physical Anesthesia Plan  ASA:   Anesthesia Plan:    Post-op Pain Management:    Induction:   Airway Management Planned:   Additional Equipment:   Intra-op Plan:   Post-operative Plan:   Informed Consent:   Plan Discussed with:   Anesthesia Plan Comments: (See my anesthesia note--bolded paragraph. Shonna ChockAllison Curran Lenderman, PA-C)        Anesthesia Quick Evaluation

## 2015-08-15 NOTE — Progress Notes (Addendum)
Anesthesia Chart Review: Patient is a 65 year old female scheduled for I&D RLE, application of Acell and wound VAC on 08/16/15 by Dr. Ulice Boldillingham.  History includes former smoker, HTN, COPD, exertional dyspnea,  Obesity. She fell 07/07/15 and suffered a large abrasion to her RLE and required I&D with VAC 07/12/15 and 08/01/15. PCP is Dr. Alysia PennaScott Holwerda.  For anesthesia concerns, she reported that she developed "burning in her bronchial tubes" following her 08/01/15 surgery which required GETA. Her previous surgery on 07/12/15 was done with a LMA#5. She is requesting her anesthesia team from her 07/12/15 surgery (Dr. Judie Petitharlene Edwards and Sarita Haverokoshi Flowers, CRNA). Dr. Randa EvensEdwards [facility], but I did alert Luanna SalkPaxton, CRNA to request Flowers, CRNA if available. (Of note, LMA#4 and then LMA#5 were attempted with her 08/16/15 surgery, but there was a poor seal so she had to be converted to GETA. With this history, I'm unsure if her anesthesia team will attempt use of an LMA first or want to start with GETA. They will need to discuss this with patient on the day of surgery. Reportedly Dr. Link SnufferHolwerda had expressed some concern about her COPD if she were to require frequent intubations over the next several weeks-months while undergoing RLE surgeries. Also she reported post-operative myalgia after receiving succinylcholine on 08/01/15 which she did not have after her 07/12/15 surgery.)  Meds include albuterol, Symbicort, losartan, Singulair, Reclast.  07/08/15 EKG: ST at 101, borderline RAD, low voltage precordial leads.  07/17/15 CXR: IMPRESSION: Hyperinflation and probable emphysema. No evidence of acute traumatic process. Left costophrenic angle excluded from field of view.  Labs from 08/01/15 noted.   Further evaluation and definitive anesthesia plan following evaluation by her anesthesiologist on the day of surgery.  Velna Ochsllison Noah Lembke, PA-C [facility]/Anesthesiology Phone [phone]  [phone] 08/15/2015 10:19 AM

## 2015-08-15 NOTE — Progress Notes (Signed)
Several unsuccessful attempts were made to contact pt; lvm with new arrival time of 8:30 A.M. for tomorrows procedure.

## 2015-08-16 ENCOUNTER — Ambulatory Visit (HOSPITAL_COMMUNITY): Admission: RE | Admit: 2015-08-16 | Payer: Medicare Other | Source: Ambulatory Visit | Admitting: Plastic Surgery

## 2015-08-16 ENCOUNTER — Encounter (HOSPITAL_COMMUNITY): Payer: Medicare Other

## 2015-08-16 DIAGNOSIS — J449 Chronic obstructive pulmonary disease, unspecified: Secondary | ICD-10-CM | POA: Diagnosis not present

## 2015-08-16 DIAGNOSIS — Z87891 Personal history of nicotine dependence: Secondary | ICD-10-CM | POA: Diagnosis not present

## 2015-08-16 DIAGNOSIS — I872 Venous insufficiency (chronic) (peripheral): Secondary | ICD-10-CM | POA: Diagnosis not present

## 2015-08-16 DIAGNOSIS — N6009 Solitary cyst of unspecified breast: Secondary | ICD-10-CM | POA: Diagnosis not present

## 2015-08-16 DIAGNOSIS — I10 Essential (primary) hypertension: Secondary | ICD-10-CM | POA: Diagnosis not present

## 2015-08-16 DIAGNOSIS — L97812 Non-pressure chronic ulcer of other part of right lower leg with fat layer exposed: Secondary | ICD-10-CM | POA: Diagnosis not present

## 2015-08-16 HISTORY — DX: Adverse effect of unspecified anesthetic, initial encounter: T41.45XA

## 2015-08-16 HISTORY — DX: Other complications of anesthesia, initial encounter: T88.59XA

## 2015-08-16 SURGERY — IRRIGATION AND DEBRIDEMENT EXTREMITY
Anesthesia: General | Laterality: Right

## 2015-08-20 DIAGNOSIS — F17219 Nicotine dependence, cigarettes, with unspecified nicotine-induced disorders: Secondary | ICD-10-CM | POA: Diagnosis not present

## 2015-08-20 DIAGNOSIS — S80811D Abrasion, right lower leg, subsequent encounter: Secondary | ICD-10-CM | POA: Diagnosis not present

## 2015-08-20 DIAGNOSIS — J449 Chronic obstructive pulmonary disease, unspecified: Secondary | ICD-10-CM | POA: Diagnosis not present

## 2015-08-20 DIAGNOSIS — I10 Essential (primary) hypertension: Secondary | ICD-10-CM | POA: Diagnosis not present

## 2015-08-20 DIAGNOSIS — W109XXD Fall (on) (from) unspecified stairs and steps, subsequent encounter: Secondary | ICD-10-CM | POA: Diagnosis not present

## 2015-08-20 DIAGNOSIS — Z4800 Encounter for change or removal of nonsurgical wound dressing: Secondary | ICD-10-CM | POA: Diagnosis not present

## 2015-08-21 ENCOUNTER — Encounter (HOSPITAL_COMMUNITY): Payer: Medicare Other

## 2015-08-22 DIAGNOSIS — J449 Chronic obstructive pulmonary disease, unspecified: Secondary | ICD-10-CM | POA: Diagnosis not present

## 2015-08-22 DIAGNOSIS — I872 Venous insufficiency (chronic) (peripheral): Secondary | ICD-10-CM | POA: Diagnosis not present

## 2015-08-22 DIAGNOSIS — Z87891 Personal history of nicotine dependence: Secondary | ICD-10-CM | POA: Diagnosis not present

## 2015-08-22 DIAGNOSIS — L97812 Non-pressure chronic ulcer of other part of right lower leg with fat layer exposed: Secondary | ICD-10-CM | POA: Diagnosis not present

## 2015-08-23 ENCOUNTER — Encounter (HOSPITAL_COMMUNITY): Payer: Medicare Other

## 2015-08-24 DIAGNOSIS — I10 Essential (primary) hypertension: Secondary | ICD-10-CM | POA: Diagnosis not present

## 2015-08-24 DIAGNOSIS — S80811D Abrasion, right lower leg, subsequent encounter: Secondary | ICD-10-CM | POA: Diagnosis not present

## 2015-08-24 DIAGNOSIS — W109XXD Fall (on) (from) unspecified stairs and steps, subsequent encounter: Secondary | ICD-10-CM | POA: Diagnosis not present

## 2015-08-24 DIAGNOSIS — Z4800 Encounter for change or removal of nonsurgical wound dressing: Secondary | ICD-10-CM | POA: Diagnosis not present

## 2015-08-24 DIAGNOSIS — J449 Chronic obstructive pulmonary disease, unspecified: Secondary | ICD-10-CM | POA: Diagnosis not present

## 2015-08-24 DIAGNOSIS — F17219 Nicotine dependence, cigarettes, with unspecified nicotine-induced disorders: Secondary | ICD-10-CM | POA: Diagnosis not present

## 2015-08-27 DIAGNOSIS — F17219 Nicotine dependence, cigarettes, with unspecified nicotine-induced disorders: Secondary | ICD-10-CM | POA: Diagnosis not present

## 2015-08-27 DIAGNOSIS — W109XXD Fall (on) (from) unspecified stairs and steps, subsequent encounter: Secondary | ICD-10-CM | POA: Diagnosis not present

## 2015-08-27 DIAGNOSIS — S80811D Abrasion, right lower leg, subsequent encounter: Secondary | ICD-10-CM | POA: Diagnosis not present

## 2015-08-27 DIAGNOSIS — I10 Essential (primary) hypertension: Secondary | ICD-10-CM | POA: Diagnosis not present

## 2015-08-27 DIAGNOSIS — J449 Chronic obstructive pulmonary disease, unspecified: Secondary | ICD-10-CM | POA: Diagnosis not present

## 2015-08-27 DIAGNOSIS — Z4800 Encounter for change or removal of nonsurgical wound dressing: Secondary | ICD-10-CM | POA: Diagnosis not present

## 2015-08-28 ENCOUNTER — Encounter (HOSPITAL_COMMUNITY): Payer: Medicare Other

## 2015-08-29 DIAGNOSIS — L97812 Non-pressure chronic ulcer of other part of right lower leg with fat layer exposed: Secondary | ICD-10-CM | POA: Diagnosis not present

## 2015-08-29 DIAGNOSIS — J449 Chronic obstructive pulmonary disease, unspecified: Secondary | ICD-10-CM | POA: Diagnosis not present

## 2015-08-29 DIAGNOSIS — I872 Venous insufficiency (chronic) (peripheral): Secondary | ICD-10-CM | POA: Diagnosis not present

## 2015-08-29 DIAGNOSIS — Z87891 Personal history of nicotine dependence: Secondary | ICD-10-CM | POA: Diagnosis not present

## 2015-08-30 ENCOUNTER — Encounter (HOSPITAL_COMMUNITY): Payer: Medicare Other

## 2015-08-31 DIAGNOSIS — S80811D Abrasion, right lower leg, subsequent encounter: Secondary | ICD-10-CM | POA: Diagnosis not present

## 2015-08-31 DIAGNOSIS — Z4800 Encounter for change or removal of nonsurgical wound dressing: Secondary | ICD-10-CM | POA: Diagnosis not present

## 2015-08-31 DIAGNOSIS — W109XXD Fall (on) (from) unspecified stairs and steps, subsequent encounter: Secondary | ICD-10-CM | POA: Diagnosis not present

## 2015-08-31 DIAGNOSIS — J449 Chronic obstructive pulmonary disease, unspecified: Secondary | ICD-10-CM | POA: Diagnosis not present

## 2015-08-31 DIAGNOSIS — I10 Essential (primary) hypertension: Secondary | ICD-10-CM | POA: Diagnosis not present

## 2015-08-31 DIAGNOSIS — F17219 Nicotine dependence, cigarettes, with unspecified nicotine-induced disorders: Secondary | ICD-10-CM | POA: Diagnosis not present

## 2015-09-03 DIAGNOSIS — W109XXD Fall (on) (from) unspecified stairs and steps, subsequent encounter: Secondary | ICD-10-CM | POA: Diagnosis not present

## 2015-09-03 DIAGNOSIS — S80811D Abrasion, right lower leg, subsequent encounter: Secondary | ICD-10-CM | POA: Diagnosis not present

## 2015-09-03 DIAGNOSIS — I10 Essential (primary) hypertension: Secondary | ICD-10-CM | POA: Diagnosis not present

## 2015-09-03 DIAGNOSIS — F17219 Nicotine dependence, cigarettes, with unspecified nicotine-induced disorders: Secondary | ICD-10-CM | POA: Diagnosis not present

## 2015-09-03 DIAGNOSIS — J449 Chronic obstructive pulmonary disease, unspecified: Secondary | ICD-10-CM | POA: Diagnosis not present

## 2015-09-03 DIAGNOSIS — Z4800 Encounter for change or removal of nonsurgical wound dressing: Secondary | ICD-10-CM | POA: Diagnosis not present

## 2015-09-04 ENCOUNTER — Encounter (HOSPITAL_COMMUNITY): Payer: Medicare Other

## 2015-09-05 DIAGNOSIS — J449 Chronic obstructive pulmonary disease, unspecified: Secondary | ICD-10-CM | POA: Diagnosis not present

## 2015-09-05 DIAGNOSIS — Z87891 Personal history of nicotine dependence: Secondary | ICD-10-CM | POA: Diagnosis not present

## 2015-09-05 DIAGNOSIS — L97812 Non-pressure chronic ulcer of other part of right lower leg with fat layer exposed: Secondary | ICD-10-CM | POA: Diagnosis not present

## 2015-09-05 DIAGNOSIS — I872 Venous insufficiency (chronic) (peripheral): Secondary | ICD-10-CM | POA: Diagnosis not present

## 2015-09-07 DIAGNOSIS — Z4800 Encounter for change or removal of nonsurgical wound dressing: Secondary | ICD-10-CM | POA: Diagnosis not present

## 2015-09-07 DIAGNOSIS — W109XXD Fall (on) (from) unspecified stairs and steps, subsequent encounter: Secondary | ICD-10-CM | POA: Diagnosis not present

## 2015-09-07 DIAGNOSIS — J449 Chronic obstructive pulmonary disease, unspecified: Secondary | ICD-10-CM | POA: Diagnosis not present

## 2015-09-07 DIAGNOSIS — I10 Essential (primary) hypertension: Secondary | ICD-10-CM | POA: Diagnosis not present

## 2015-09-07 DIAGNOSIS — F17219 Nicotine dependence, cigarettes, with unspecified nicotine-induced disorders: Secondary | ICD-10-CM | POA: Diagnosis not present

## 2015-09-07 DIAGNOSIS — S80811D Abrasion, right lower leg, subsequent encounter: Secondary | ICD-10-CM | POA: Diagnosis not present

## 2015-09-09 DIAGNOSIS — W109XXD Fall (on) (from) unspecified stairs and steps, subsequent encounter: Secondary | ICD-10-CM | POA: Diagnosis not present

## 2015-09-09 DIAGNOSIS — J449 Chronic obstructive pulmonary disease, unspecified: Secondary | ICD-10-CM | POA: Diagnosis not present

## 2015-09-09 DIAGNOSIS — I10 Essential (primary) hypertension: Secondary | ICD-10-CM | POA: Diagnosis not present

## 2015-09-09 DIAGNOSIS — Z4800 Encounter for change or removal of nonsurgical wound dressing: Secondary | ICD-10-CM | POA: Diagnosis not present

## 2015-09-09 DIAGNOSIS — F17219 Nicotine dependence, cigarettes, with unspecified nicotine-induced disorders: Secondary | ICD-10-CM | POA: Diagnosis not present

## 2015-09-09 DIAGNOSIS — S80811D Abrasion, right lower leg, subsequent encounter: Secondary | ICD-10-CM | POA: Diagnosis not present

## 2015-09-10 DIAGNOSIS — I10 Essential (primary) hypertension: Secondary | ICD-10-CM | POA: Diagnosis not present

## 2015-09-10 DIAGNOSIS — Z4800 Encounter for change or removal of nonsurgical wound dressing: Secondary | ICD-10-CM | POA: Diagnosis not present

## 2015-09-10 DIAGNOSIS — J449 Chronic obstructive pulmonary disease, unspecified: Secondary | ICD-10-CM | POA: Diagnosis not present

## 2015-09-10 DIAGNOSIS — W109XXD Fall (on) (from) unspecified stairs and steps, subsequent encounter: Secondary | ICD-10-CM | POA: Diagnosis not present

## 2015-09-10 DIAGNOSIS — S80811D Abrasion, right lower leg, subsequent encounter: Secondary | ICD-10-CM | POA: Diagnosis not present

## 2015-09-10 DIAGNOSIS — F17219 Nicotine dependence, cigarettes, with unspecified nicotine-induced disorders: Secondary | ICD-10-CM | POA: Diagnosis not present

## 2015-09-12 DIAGNOSIS — L97812 Non-pressure chronic ulcer of other part of right lower leg with fat layer exposed: Secondary | ICD-10-CM | POA: Diagnosis not present

## 2015-09-12 DIAGNOSIS — Z87891 Personal history of nicotine dependence: Secondary | ICD-10-CM | POA: Diagnosis not present

## 2015-09-12 DIAGNOSIS — I872 Venous insufficiency (chronic) (peripheral): Secondary | ICD-10-CM | POA: Diagnosis not present

## 2015-09-12 DIAGNOSIS — J449 Chronic obstructive pulmonary disease, unspecified: Secondary | ICD-10-CM | POA: Diagnosis not present

## 2015-09-14 DIAGNOSIS — Z4800 Encounter for change or removal of nonsurgical wound dressing: Secondary | ICD-10-CM | POA: Diagnosis not present

## 2015-09-14 DIAGNOSIS — I10 Essential (primary) hypertension: Secondary | ICD-10-CM | POA: Diagnosis not present

## 2015-09-14 DIAGNOSIS — F17219 Nicotine dependence, cigarettes, with unspecified nicotine-induced disorders: Secondary | ICD-10-CM | POA: Diagnosis not present

## 2015-09-14 DIAGNOSIS — W109XXD Fall (on) (from) unspecified stairs and steps, subsequent encounter: Secondary | ICD-10-CM | POA: Diagnosis not present

## 2015-09-14 DIAGNOSIS — S80811D Abrasion, right lower leg, subsequent encounter: Secondary | ICD-10-CM | POA: Diagnosis not present

## 2015-09-14 DIAGNOSIS — J449 Chronic obstructive pulmonary disease, unspecified: Secondary | ICD-10-CM | POA: Diagnosis not present

## 2015-09-17 DIAGNOSIS — Z4800 Encounter for change or removal of nonsurgical wound dressing: Secondary | ICD-10-CM | POA: Diagnosis not present

## 2015-09-17 DIAGNOSIS — I10 Essential (primary) hypertension: Secondary | ICD-10-CM | POA: Diagnosis not present

## 2015-09-17 DIAGNOSIS — J449 Chronic obstructive pulmonary disease, unspecified: Secondary | ICD-10-CM | POA: Diagnosis not present

## 2015-09-17 DIAGNOSIS — W109XXD Fall (on) (from) unspecified stairs and steps, subsequent encounter: Secondary | ICD-10-CM | POA: Diagnosis not present

## 2015-09-17 DIAGNOSIS — F17219 Nicotine dependence, cigarettes, with unspecified nicotine-induced disorders: Secondary | ICD-10-CM | POA: Diagnosis not present

## 2015-09-17 DIAGNOSIS — S80811D Abrasion, right lower leg, subsequent encounter: Secondary | ICD-10-CM | POA: Diagnosis not present

## 2015-09-18 DIAGNOSIS — J449 Chronic obstructive pulmonary disease, unspecified: Secondary | ICD-10-CM | POA: Diagnosis not present

## 2015-09-18 DIAGNOSIS — F17219 Nicotine dependence, cigarettes, with unspecified nicotine-induced disorders: Secondary | ICD-10-CM | POA: Diagnosis not present

## 2015-09-18 DIAGNOSIS — S80811D Abrasion, right lower leg, subsequent encounter: Secondary | ICD-10-CM | POA: Diagnosis not present

## 2015-09-18 DIAGNOSIS — W109XXD Fall (on) (from) unspecified stairs and steps, subsequent encounter: Secondary | ICD-10-CM | POA: Diagnosis not present

## 2015-09-18 DIAGNOSIS — Z4800 Encounter for change or removal of nonsurgical wound dressing: Secondary | ICD-10-CM | POA: Diagnosis not present

## 2015-09-18 DIAGNOSIS — I10 Essential (primary) hypertension: Secondary | ICD-10-CM | POA: Diagnosis not present

## 2015-09-19 DIAGNOSIS — L97912 Non-pressure chronic ulcer of unspecified part of right lower leg with fat layer exposed: Secondary | ICD-10-CM | POA: Diagnosis not present

## 2015-09-19 DIAGNOSIS — L97812 Non-pressure chronic ulcer of other part of right lower leg with fat layer exposed: Secondary | ICD-10-CM | POA: Diagnosis not present

## 2015-09-19 DIAGNOSIS — I872 Venous insufficiency (chronic) (peripheral): Secondary | ICD-10-CM | POA: Diagnosis not present

## 2015-09-19 DIAGNOSIS — J449 Chronic obstructive pulmonary disease, unspecified: Secondary | ICD-10-CM | POA: Diagnosis not present

## 2015-09-20 DIAGNOSIS — H34832 Tributary (branch) retinal vein occlusion, left eye, with macular edema: Secondary | ICD-10-CM | POA: Diagnosis not present

## 2015-09-21 DIAGNOSIS — S80811D Abrasion, right lower leg, subsequent encounter: Secondary | ICD-10-CM | POA: Diagnosis not present

## 2015-09-21 DIAGNOSIS — J449 Chronic obstructive pulmonary disease, unspecified: Secondary | ICD-10-CM | POA: Diagnosis not present

## 2015-09-21 DIAGNOSIS — W109XXD Fall (on) (from) unspecified stairs and steps, subsequent encounter: Secondary | ICD-10-CM | POA: Diagnosis not present

## 2015-09-21 DIAGNOSIS — F17219 Nicotine dependence, cigarettes, with unspecified nicotine-induced disorders: Secondary | ICD-10-CM | POA: Diagnosis not present

## 2015-09-21 DIAGNOSIS — Z4800 Encounter for change or removal of nonsurgical wound dressing: Secondary | ICD-10-CM | POA: Diagnosis not present

## 2015-09-21 DIAGNOSIS — I10 Essential (primary) hypertension: Secondary | ICD-10-CM | POA: Diagnosis not present

## 2015-09-24 DIAGNOSIS — Z4800 Encounter for change or removal of nonsurgical wound dressing: Secondary | ICD-10-CM | POA: Diagnosis not present

## 2015-09-24 DIAGNOSIS — W109XXD Fall (on) (from) unspecified stairs and steps, subsequent encounter: Secondary | ICD-10-CM | POA: Diagnosis not present

## 2015-09-24 DIAGNOSIS — I10 Essential (primary) hypertension: Secondary | ICD-10-CM | POA: Diagnosis not present

## 2015-09-24 DIAGNOSIS — F17219 Nicotine dependence, cigarettes, with unspecified nicotine-induced disorders: Secondary | ICD-10-CM | POA: Diagnosis not present

## 2015-09-24 DIAGNOSIS — S80811D Abrasion, right lower leg, subsequent encounter: Secondary | ICD-10-CM | POA: Diagnosis not present

## 2015-09-24 DIAGNOSIS — J449 Chronic obstructive pulmonary disease, unspecified: Secondary | ICD-10-CM | POA: Diagnosis not present

## 2015-09-26 DIAGNOSIS — L97812 Non-pressure chronic ulcer of other part of right lower leg with fat layer exposed: Secondary | ICD-10-CM | POA: Diagnosis not present

## 2015-09-26 DIAGNOSIS — I872 Venous insufficiency (chronic) (peripheral): Secondary | ICD-10-CM | POA: Diagnosis not present

## 2015-09-26 DIAGNOSIS — J449 Chronic obstructive pulmonary disease, unspecified: Secondary | ICD-10-CM | POA: Diagnosis not present

## 2015-09-26 DIAGNOSIS — Z87891 Personal history of nicotine dependence: Secondary | ICD-10-CM | POA: Diagnosis not present

## 2015-09-28 DIAGNOSIS — J449 Chronic obstructive pulmonary disease, unspecified: Secondary | ICD-10-CM | POA: Diagnosis not present

## 2015-09-28 DIAGNOSIS — S80811D Abrasion, right lower leg, subsequent encounter: Secondary | ICD-10-CM | POA: Diagnosis not present

## 2015-09-28 DIAGNOSIS — F17219 Nicotine dependence, cigarettes, with unspecified nicotine-induced disorders: Secondary | ICD-10-CM | POA: Diagnosis not present

## 2015-09-28 DIAGNOSIS — I10 Essential (primary) hypertension: Secondary | ICD-10-CM | POA: Diagnosis not present

## 2015-09-28 DIAGNOSIS — Z4800 Encounter for change or removal of nonsurgical wound dressing: Secondary | ICD-10-CM | POA: Diagnosis not present

## 2015-09-28 DIAGNOSIS — W109XXD Fall (on) (from) unspecified stairs and steps, subsequent encounter: Secondary | ICD-10-CM | POA: Diagnosis not present

## 2015-10-01 DIAGNOSIS — I10 Essential (primary) hypertension: Secondary | ICD-10-CM | POA: Diagnosis not present

## 2015-10-01 DIAGNOSIS — S80811D Abrasion, right lower leg, subsequent encounter: Secondary | ICD-10-CM | POA: Diagnosis not present

## 2015-10-01 DIAGNOSIS — J449 Chronic obstructive pulmonary disease, unspecified: Secondary | ICD-10-CM | POA: Diagnosis not present

## 2015-10-01 DIAGNOSIS — Z4800 Encounter for change or removal of nonsurgical wound dressing: Secondary | ICD-10-CM | POA: Diagnosis not present

## 2015-10-01 DIAGNOSIS — W109XXD Fall (on) (from) unspecified stairs and steps, subsequent encounter: Secondary | ICD-10-CM | POA: Diagnosis not present

## 2015-10-01 DIAGNOSIS — F17219 Nicotine dependence, cigarettes, with unspecified nicotine-induced disorders: Secondary | ICD-10-CM | POA: Diagnosis not present

## 2015-10-03 DIAGNOSIS — J449 Chronic obstructive pulmonary disease, unspecified: Secondary | ICD-10-CM | POA: Diagnosis not present

## 2015-10-03 DIAGNOSIS — S80811D Abrasion, right lower leg, subsequent encounter: Secondary | ICD-10-CM | POA: Diagnosis not present

## 2015-10-03 DIAGNOSIS — F17219 Nicotine dependence, cigarettes, with unspecified nicotine-induced disorders: Secondary | ICD-10-CM | POA: Diagnosis not present

## 2015-10-03 DIAGNOSIS — I10 Essential (primary) hypertension: Secondary | ICD-10-CM | POA: Diagnosis not present

## 2015-10-03 DIAGNOSIS — Z4800 Encounter for change or removal of nonsurgical wound dressing: Secondary | ICD-10-CM | POA: Diagnosis not present

## 2015-10-03 DIAGNOSIS — W109XXD Fall (on) (from) unspecified stairs and steps, subsequent encounter: Secondary | ICD-10-CM | POA: Diagnosis not present

## 2015-10-05 DIAGNOSIS — J449 Chronic obstructive pulmonary disease, unspecified: Secondary | ICD-10-CM | POA: Diagnosis not present

## 2015-10-05 DIAGNOSIS — F17219 Nicotine dependence, cigarettes, with unspecified nicotine-induced disorders: Secondary | ICD-10-CM | POA: Diagnosis not present

## 2015-10-05 DIAGNOSIS — S80811D Abrasion, right lower leg, subsequent encounter: Secondary | ICD-10-CM | POA: Diagnosis not present

## 2015-10-05 DIAGNOSIS — W109XXD Fall (on) (from) unspecified stairs and steps, subsequent encounter: Secondary | ICD-10-CM | POA: Diagnosis not present

## 2015-10-05 DIAGNOSIS — Z4800 Encounter for change or removal of nonsurgical wound dressing: Secondary | ICD-10-CM | POA: Diagnosis not present

## 2015-10-05 DIAGNOSIS — I10 Essential (primary) hypertension: Secondary | ICD-10-CM | POA: Diagnosis not present

## 2015-10-08 DIAGNOSIS — I10 Essential (primary) hypertension: Secondary | ICD-10-CM | POA: Diagnosis not present

## 2015-10-08 DIAGNOSIS — J449 Chronic obstructive pulmonary disease, unspecified: Secondary | ICD-10-CM | POA: Diagnosis not present

## 2015-10-08 DIAGNOSIS — S80811D Abrasion, right lower leg, subsequent encounter: Secondary | ICD-10-CM | POA: Diagnosis not present

## 2015-10-08 DIAGNOSIS — F17219 Nicotine dependence, cigarettes, with unspecified nicotine-induced disorders: Secondary | ICD-10-CM | POA: Diagnosis not present

## 2015-10-08 DIAGNOSIS — W109XXD Fall (on) (from) unspecified stairs and steps, subsequent encounter: Secondary | ICD-10-CM | POA: Diagnosis not present

## 2015-10-08 DIAGNOSIS — Z4800 Encounter for change or removal of nonsurgical wound dressing: Secondary | ICD-10-CM | POA: Diagnosis not present

## 2015-10-10 DIAGNOSIS — J449 Chronic obstructive pulmonary disease, unspecified: Secondary | ICD-10-CM | POA: Diagnosis not present

## 2015-10-10 DIAGNOSIS — Z87891 Personal history of nicotine dependence: Secondary | ICD-10-CM | POA: Diagnosis not present

## 2015-10-10 DIAGNOSIS — L97812 Non-pressure chronic ulcer of other part of right lower leg with fat layer exposed: Secondary | ICD-10-CM | POA: Diagnosis not present

## 2015-10-10 DIAGNOSIS — I872 Venous insufficiency (chronic) (peripheral): Secondary | ICD-10-CM | POA: Diagnosis not present

## 2015-10-12 DIAGNOSIS — W109XXD Fall (on) (from) unspecified stairs and steps, subsequent encounter: Secondary | ICD-10-CM | POA: Diagnosis not present

## 2015-10-12 DIAGNOSIS — J449 Chronic obstructive pulmonary disease, unspecified: Secondary | ICD-10-CM | POA: Diagnosis not present

## 2015-10-12 DIAGNOSIS — F17219 Nicotine dependence, cigarettes, with unspecified nicotine-induced disorders: Secondary | ICD-10-CM | POA: Diagnosis not present

## 2015-10-12 DIAGNOSIS — I10 Essential (primary) hypertension: Secondary | ICD-10-CM | POA: Diagnosis not present

## 2015-10-12 DIAGNOSIS — S80811D Abrasion, right lower leg, subsequent encounter: Secondary | ICD-10-CM | POA: Diagnosis not present

## 2015-10-12 DIAGNOSIS — Z4800 Encounter for change or removal of nonsurgical wound dressing: Secondary | ICD-10-CM | POA: Diagnosis not present

## 2015-10-15 DIAGNOSIS — W109XXD Fall (on) (from) unspecified stairs and steps, subsequent encounter: Secondary | ICD-10-CM | POA: Diagnosis not present

## 2015-10-15 DIAGNOSIS — J449 Chronic obstructive pulmonary disease, unspecified: Secondary | ICD-10-CM | POA: Diagnosis not present

## 2015-10-15 DIAGNOSIS — S80811D Abrasion, right lower leg, subsequent encounter: Secondary | ICD-10-CM | POA: Diagnosis not present

## 2015-10-15 DIAGNOSIS — Z4800 Encounter for change or removal of nonsurgical wound dressing: Secondary | ICD-10-CM | POA: Diagnosis not present

## 2015-10-15 DIAGNOSIS — F17219 Nicotine dependence, cigarettes, with unspecified nicotine-induced disorders: Secondary | ICD-10-CM | POA: Diagnosis not present

## 2015-10-15 DIAGNOSIS — I10 Essential (primary) hypertension: Secondary | ICD-10-CM | POA: Diagnosis not present

## 2015-10-17 DIAGNOSIS — Z87891 Personal history of nicotine dependence: Secondary | ICD-10-CM | POA: Diagnosis not present

## 2015-10-17 DIAGNOSIS — L97812 Non-pressure chronic ulcer of other part of right lower leg with fat layer exposed: Secondary | ICD-10-CM | POA: Diagnosis not present

## 2015-10-17 DIAGNOSIS — I872 Venous insufficiency (chronic) (peripheral): Secondary | ICD-10-CM | POA: Diagnosis not present

## 2015-10-19 DIAGNOSIS — Z4800 Encounter for change or removal of nonsurgical wound dressing: Secondary | ICD-10-CM | POA: Diagnosis not present

## 2015-10-19 DIAGNOSIS — S80811D Abrasion, right lower leg, subsequent encounter: Secondary | ICD-10-CM | POA: Diagnosis not present

## 2015-10-19 DIAGNOSIS — J449 Chronic obstructive pulmonary disease, unspecified: Secondary | ICD-10-CM | POA: Diagnosis not present

## 2015-10-19 DIAGNOSIS — W109XXD Fall (on) (from) unspecified stairs and steps, subsequent encounter: Secondary | ICD-10-CM | POA: Diagnosis not present

## 2015-10-19 DIAGNOSIS — I10 Essential (primary) hypertension: Secondary | ICD-10-CM | POA: Diagnosis not present

## 2015-10-19 DIAGNOSIS — F17219 Nicotine dependence, cigarettes, with unspecified nicotine-induced disorders: Secondary | ICD-10-CM | POA: Diagnosis not present

## 2015-10-22 DIAGNOSIS — Z4800 Encounter for change or removal of nonsurgical wound dressing: Secondary | ICD-10-CM | POA: Diagnosis not present

## 2015-10-22 DIAGNOSIS — W109XXD Fall (on) (from) unspecified stairs and steps, subsequent encounter: Secondary | ICD-10-CM | POA: Diagnosis not present

## 2015-10-22 DIAGNOSIS — I10 Essential (primary) hypertension: Secondary | ICD-10-CM | POA: Diagnosis not present

## 2015-10-22 DIAGNOSIS — S80811D Abrasion, right lower leg, subsequent encounter: Secondary | ICD-10-CM | POA: Diagnosis not present

## 2015-10-22 DIAGNOSIS — J449 Chronic obstructive pulmonary disease, unspecified: Secondary | ICD-10-CM | POA: Diagnosis not present

## 2015-10-22 DIAGNOSIS — F17219 Nicotine dependence, cigarettes, with unspecified nicotine-induced disorders: Secondary | ICD-10-CM | POA: Diagnosis not present

## 2015-10-24 DIAGNOSIS — Z87891 Personal history of nicotine dependence: Secondary | ICD-10-CM | POA: Diagnosis not present

## 2015-10-24 DIAGNOSIS — L97812 Non-pressure chronic ulcer of other part of right lower leg with fat layer exposed: Secondary | ICD-10-CM | POA: Diagnosis not present

## 2015-10-24 DIAGNOSIS — I872 Venous insufficiency (chronic) (peripheral): Secondary | ICD-10-CM | POA: Diagnosis not present

## 2015-10-24 DIAGNOSIS — J449 Chronic obstructive pulmonary disease, unspecified: Secondary | ICD-10-CM | POA: Diagnosis not present

## 2015-10-26 DIAGNOSIS — W109XXD Fall (on) (from) unspecified stairs and steps, subsequent encounter: Secondary | ICD-10-CM | POA: Diagnosis not present

## 2015-10-26 DIAGNOSIS — S80811D Abrasion, right lower leg, subsequent encounter: Secondary | ICD-10-CM | POA: Diagnosis not present

## 2015-10-26 DIAGNOSIS — Z4800 Encounter for change or removal of nonsurgical wound dressing: Secondary | ICD-10-CM | POA: Diagnosis not present

## 2015-10-26 DIAGNOSIS — I10 Essential (primary) hypertension: Secondary | ICD-10-CM | POA: Diagnosis not present

## 2015-10-26 DIAGNOSIS — J449 Chronic obstructive pulmonary disease, unspecified: Secondary | ICD-10-CM | POA: Diagnosis not present

## 2015-10-26 DIAGNOSIS — F17219 Nicotine dependence, cigarettes, with unspecified nicotine-induced disorders: Secondary | ICD-10-CM | POA: Diagnosis not present

## 2015-10-29 DIAGNOSIS — S80811D Abrasion, right lower leg, subsequent encounter: Secondary | ICD-10-CM | POA: Diagnosis not present

## 2015-10-29 DIAGNOSIS — Z4800 Encounter for change or removal of nonsurgical wound dressing: Secondary | ICD-10-CM | POA: Diagnosis not present

## 2015-10-29 DIAGNOSIS — W109XXD Fall (on) (from) unspecified stairs and steps, subsequent encounter: Secondary | ICD-10-CM | POA: Diagnosis not present

## 2015-10-29 DIAGNOSIS — I10 Essential (primary) hypertension: Secondary | ICD-10-CM | POA: Diagnosis not present

## 2015-10-29 DIAGNOSIS — J449 Chronic obstructive pulmonary disease, unspecified: Secondary | ICD-10-CM | POA: Diagnosis not present

## 2015-10-29 DIAGNOSIS — F17219 Nicotine dependence, cigarettes, with unspecified nicotine-induced disorders: Secondary | ICD-10-CM | POA: Diagnosis not present

## 2015-10-31 DIAGNOSIS — I872 Venous insufficiency (chronic) (peripheral): Secondary | ICD-10-CM | POA: Diagnosis not present

## 2015-10-31 DIAGNOSIS — L97812 Non-pressure chronic ulcer of other part of right lower leg with fat layer exposed: Secondary | ICD-10-CM | POA: Diagnosis not present

## 2015-10-31 DIAGNOSIS — Z87891 Personal history of nicotine dependence: Secondary | ICD-10-CM | POA: Diagnosis not present

## 2015-10-31 DIAGNOSIS — J449 Chronic obstructive pulmonary disease, unspecified: Secondary | ICD-10-CM | POA: Diagnosis not present

## 2015-11-02 DIAGNOSIS — J449 Chronic obstructive pulmonary disease, unspecified: Secondary | ICD-10-CM | POA: Diagnosis not present

## 2015-11-02 DIAGNOSIS — F17219 Nicotine dependence, cigarettes, with unspecified nicotine-induced disorders: Secondary | ICD-10-CM | POA: Diagnosis not present

## 2015-11-02 DIAGNOSIS — W109XXD Fall (on) (from) unspecified stairs and steps, subsequent encounter: Secondary | ICD-10-CM | POA: Diagnosis not present

## 2015-11-02 DIAGNOSIS — S80811D Abrasion, right lower leg, subsequent encounter: Secondary | ICD-10-CM | POA: Diagnosis not present

## 2015-11-02 DIAGNOSIS — I10 Essential (primary) hypertension: Secondary | ICD-10-CM | POA: Diagnosis not present

## 2015-11-02 DIAGNOSIS — Z4800 Encounter for change or removal of nonsurgical wound dressing: Secondary | ICD-10-CM | POA: Diagnosis not present

## 2015-11-05 DIAGNOSIS — I10 Essential (primary) hypertension: Secondary | ICD-10-CM | POA: Diagnosis not present

## 2015-11-05 DIAGNOSIS — S80811D Abrasion, right lower leg, subsequent encounter: Secondary | ICD-10-CM | POA: Diagnosis not present

## 2015-11-05 DIAGNOSIS — W109XXD Fall (on) (from) unspecified stairs and steps, subsequent encounter: Secondary | ICD-10-CM | POA: Diagnosis not present

## 2015-11-05 DIAGNOSIS — Z4800 Encounter for change or removal of nonsurgical wound dressing: Secondary | ICD-10-CM | POA: Diagnosis not present

## 2015-11-05 DIAGNOSIS — J449 Chronic obstructive pulmonary disease, unspecified: Secondary | ICD-10-CM | POA: Diagnosis not present

## 2015-11-05 DIAGNOSIS — F17219 Nicotine dependence, cigarettes, with unspecified nicotine-induced disorders: Secondary | ICD-10-CM | POA: Diagnosis not present

## 2015-11-07 DIAGNOSIS — J449 Chronic obstructive pulmonary disease, unspecified: Secondary | ICD-10-CM | POA: Diagnosis not present

## 2015-11-07 DIAGNOSIS — Z87891 Personal history of nicotine dependence: Secondary | ICD-10-CM | POA: Diagnosis not present

## 2015-11-07 DIAGNOSIS — L97812 Non-pressure chronic ulcer of other part of right lower leg with fat layer exposed: Secondary | ICD-10-CM | POA: Diagnosis not present

## 2015-11-07 DIAGNOSIS — I872 Venous insufficiency (chronic) (peripheral): Secondary | ICD-10-CM | POA: Diagnosis not present

## 2015-11-08 DIAGNOSIS — Z4800 Encounter for change or removal of nonsurgical wound dressing: Secondary | ICD-10-CM | POA: Diagnosis not present

## 2015-11-08 DIAGNOSIS — J449 Chronic obstructive pulmonary disease, unspecified: Secondary | ICD-10-CM | POA: Diagnosis not present

## 2015-11-08 DIAGNOSIS — S80811D Abrasion, right lower leg, subsequent encounter: Secondary | ICD-10-CM | POA: Diagnosis not present

## 2015-11-08 DIAGNOSIS — F17219 Nicotine dependence, cigarettes, with unspecified nicotine-induced disorders: Secondary | ICD-10-CM | POA: Diagnosis not present

## 2015-11-08 DIAGNOSIS — W109XXD Fall (on) (from) unspecified stairs and steps, subsequent encounter: Secondary | ICD-10-CM | POA: Diagnosis not present

## 2015-11-08 DIAGNOSIS — I10 Essential (primary) hypertension: Secondary | ICD-10-CM | POA: Diagnosis not present

## 2015-11-12 DIAGNOSIS — S80811D Abrasion, right lower leg, subsequent encounter: Secondary | ICD-10-CM | POA: Diagnosis not present

## 2015-11-12 DIAGNOSIS — J449 Chronic obstructive pulmonary disease, unspecified: Secondary | ICD-10-CM | POA: Diagnosis not present

## 2015-11-12 DIAGNOSIS — I10 Essential (primary) hypertension: Secondary | ICD-10-CM | POA: Diagnosis not present

## 2015-11-12 DIAGNOSIS — Z4800 Encounter for change or removal of nonsurgical wound dressing: Secondary | ICD-10-CM | POA: Diagnosis not present

## 2015-11-12 DIAGNOSIS — F17219 Nicotine dependence, cigarettes, with unspecified nicotine-induced disorders: Secondary | ICD-10-CM | POA: Diagnosis not present

## 2015-11-12 DIAGNOSIS — W109XXD Fall (on) (from) unspecified stairs and steps, subsequent encounter: Secondary | ICD-10-CM | POA: Diagnosis not present

## 2015-11-13 DIAGNOSIS — S80811D Abrasion, right lower leg, subsequent encounter: Secondary | ICD-10-CM | POA: Diagnosis not present

## 2015-11-13 DIAGNOSIS — Z4800 Encounter for change or removal of nonsurgical wound dressing: Secondary | ICD-10-CM | POA: Diagnosis not present

## 2015-11-13 DIAGNOSIS — J449 Chronic obstructive pulmonary disease, unspecified: Secondary | ICD-10-CM | POA: Diagnosis not present

## 2015-11-13 DIAGNOSIS — W109XXD Fall (on) (from) unspecified stairs and steps, subsequent encounter: Secondary | ICD-10-CM | POA: Diagnosis not present

## 2015-11-13 DIAGNOSIS — I10 Essential (primary) hypertension: Secondary | ICD-10-CM | POA: Diagnosis not present

## 2015-11-13 DIAGNOSIS — F17219 Nicotine dependence, cigarettes, with unspecified nicotine-induced disorders: Secondary | ICD-10-CM | POA: Diagnosis not present

## 2015-11-14 DIAGNOSIS — J449 Chronic obstructive pulmonary disease, unspecified: Secondary | ICD-10-CM | POA: Diagnosis not present

## 2015-11-14 DIAGNOSIS — L97812 Non-pressure chronic ulcer of other part of right lower leg with fat layer exposed: Secondary | ICD-10-CM | POA: Diagnosis not present

## 2015-11-14 DIAGNOSIS — I872 Venous insufficiency (chronic) (peripheral): Secondary | ICD-10-CM | POA: Diagnosis not present

## 2015-11-14 DIAGNOSIS — Z87891 Personal history of nicotine dependence: Secondary | ICD-10-CM | POA: Diagnosis not present

## 2015-11-16 DIAGNOSIS — J449 Chronic obstructive pulmonary disease, unspecified: Secondary | ICD-10-CM | POA: Diagnosis not present

## 2015-11-16 DIAGNOSIS — S80811D Abrasion, right lower leg, subsequent encounter: Secondary | ICD-10-CM | POA: Diagnosis not present

## 2015-11-16 DIAGNOSIS — W109XXD Fall (on) (from) unspecified stairs and steps, subsequent encounter: Secondary | ICD-10-CM | POA: Diagnosis not present

## 2015-11-16 DIAGNOSIS — F17219 Nicotine dependence, cigarettes, with unspecified nicotine-induced disorders: Secondary | ICD-10-CM | POA: Diagnosis not present

## 2015-11-16 DIAGNOSIS — I10 Essential (primary) hypertension: Secondary | ICD-10-CM | POA: Diagnosis not present

## 2015-11-16 DIAGNOSIS — Z4800 Encounter for change or removal of nonsurgical wound dressing: Secondary | ICD-10-CM | POA: Diagnosis not present

## 2015-11-19 DIAGNOSIS — J449 Chronic obstructive pulmonary disease, unspecified: Secondary | ICD-10-CM | POA: Diagnosis not present

## 2015-11-19 DIAGNOSIS — Z4800 Encounter for change or removal of nonsurgical wound dressing: Secondary | ICD-10-CM | POA: Diagnosis not present

## 2015-11-19 DIAGNOSIS — I10 Essential (primary) hypertension: Secondary | ICD-10-CM | POA: Diagnosis not present

## 2015-11-19 DIAGNOSIS — S80811D Abrasion, right lower leg, subsequent encounter: Secondary | ICD-10-CM | POA: Diagnosis not present

## 2015-11-19 DIAGNOSIS — F17219 Nicotine dependence, cigarettes, with unspecified nicotine-induced disorders: Secondary | ICD-10-CM | POA: Diagnosis not present

## 2015-11-19 DIAGNOSIS — W109XXD Fall (on) (from) unspecified stairs and steps, subsequent encounter: Secondary | ICD-10-CM | POA: Diagnosis not present

## 2015-11-21 DIAGNOSIS — Z87891 Personal history of nicotine dependence: Secondary | ICD-10-CM | POA: Diagnosis not present

## 2015-11-21 DIAGNOSIS — L97812 Non-pressure chronic ulcer of other part of right lower leg with fat layer exposed: Secondary | ICD-10-CM | POA: Diagnosis not present

## 2015-11-21 DIAGNOSIS — I872 Venous insufficiency (chronic) (peripheral): Secondary | ICD-10-CM | POA: Diagnosis not present

## 2015-11-21 DIAGNOSIS — J449 Chronic obstructive pulmonary disease, unspecified: Secondary | ICD-10-CM | POA: Diagnosis not present

## 2015-11-23 DIAGNOSIS — J449 Chronic obstructive pulmonary disease, unspecified: Secondary | ICD-10-CM | POA: Diagnosis not present

## 2015-11-23 DIAGNOSIS — S80811D Abrasion, right lower leg, subsequent encounter: Secondary | ICD-10-CM | POA: Diagnosis not present

## 2015-11-23 DIAGNOSIS — I10 Essential (primary) hypertension: Secondary | ICD-10-CM | POA: Diagnosis not present

## 2015-11-23 DIAGNOSIS — F17219 Nicotine dependence, cigarettes, with unspecified nicotine-induced disorders: Secondary | ICD-10-CM | POA: Diagnosis not present

## 2015-11-23 DIAGNOSIS — Z4800 Encounter for change or removal of nonsurgical wound dressing: Secondary | ICD-10-CM | POA: Diagnosis not present

## 2015-11-23 DIAGNOSIS — W109XXD Fall (on) (from) unspecified stairs and steps, subsequent encounter: Secondary | ICD-10-CM | POA: Diagnosis not present

## 2015-11-26 DIAGNOSIS — I10 Essential (primary) hypertension: Secondary | ICD-10-CM | POA: Diagnosis not present

## 2015-11-26 DIAGNOSIS — S80811D Abrasion, right lower leg, subsequent encounter: Secondary | ICD-10-CM | POA: Diagnosis not present

## 2015-11-26 DIAGNOSIS — F17219 Nicotine dependence, cigarettes, with unspecified nicotine-induced disorders: Secondary | ICD-10-CM | POA: Diagnosis not present

## 2015-11-26 DIAGNOSIS — Z4800 Encounter for change or removal of nonsurgical wound dressing: Secondary | ICD-10-CM | POA: Diagnosis not present

## 2015-11-26 DIAGNOSIS — W109XXD Fall (on) (from) unspecified stairs and steps, subsequent encounter: Secondary | ICD-10-CM | POA: Diagnosis not present

## 2015-11-26 DIAGNOSIS — J449 Chronic obstructive pulmonary disease, unspecified: Secondary | ICD-10-CM | POA: Diagnosis not present

## 2015-11-27 DIAGNOSIS — J449 Chronic obstructive pulmonary disease, unspecified: Secondary | ICD-10-CM | POA: Diagnosis not present

## 2015-11-28 DIAGNOSIS — J449 Chronic obstructive pulmonary disease, unspecified: Secondary | ICD-10-CM | POA: Diagnosis not present

## 2015-11-28 DIAGNOSIS — L97812 Non-pressure chronic ulcer of other part of right lower leg with fat layer exposed: Secondary | ICD-10-CM | POA: Diagnosis not present

## 2015-11-28 DIAGNOSIS — I872 Venous insufficiency (chronic) (peripheral): Secondary | ICD-10-CM | POA: Diagnosis not present

## 2015-11-28 DIAGNOSIS — Z87891 Personal history of nicotine dependence: Secondary | ICD-10-CM | POA: Diagnosis not present

## 2015-11-30 DIAGNOSIS — S80811D Abrasion, right lower leg, subsequent encounter: Secondary | ICD-10-CM | POA: Diagnosis not present

## 2015-11-30 DIAGNOSIS — I10 Essential (primary) hypertension: Secondary | ICD-10-CM | POA: Diagnosis not present

## 2015-11-30 DIAGNOSIS — W109XXD Fall (on) (from) unspecified stairs and steps, subsequent encounter: Secondary | ICD-10-CM | POA: Diagnosis not present

## 2015-11-30 DIAGNOSIS — J449 Chronic obstructive pulmonary disease, unspecified: Secondary | ICD-10-CM | POA: Diagnosis not present

## 2015-11-30 DIAGNOSIS — Z4800 Encounter for change or removal of nonsurgical wound dressing: Secondary | ICD-10-CM | POA: Diagnosis not present

## 2015-11-30 DIAGNOSIS — F17219 Nicotine dependence, cigarettes, with unspecified nicotine-induced disorders: Secondary | ICD-10-CM | POA: Diagnosis not present

## 2015-12-05 DIAGNOSIS — Z79899 Other long term (current) drug therapy: Secondary | ICD-10-CM | POA: Diagnosis not present

## 2015-12-05 DIAGNOSIS — L97812 Non-pressure chronic ulcer of other part of right lower leg with fat layer exposed: Secondary | ICD-10-CM | POA: Diagnosis not present

## 2015-12-05 DIAGNOSIS — J449 Chronic obstructive pulmonary disease, unspecified: Secondary | ICD-10-CM | POA: Diagnosis not present

## 2015-12-05 DIAGNOSIS — I872 Venous insufficiency (chronic) (peripheral): Secondary | ICD-10-CM | POA: Diagnosis not present

## 2015-12-06 DIAGNOSIS — H34832 Tributary (branch) retinal vein occlusion, left eye, with macular edema: Secondary | ICD-10-CM | POA: Diagnosis not present

## 2015-12-12 DIAGNOSIS — L97812 Non-pressure chronic ulcer of other part of right lower leg with fat layer exposed: Secondary | ICD-10-CM | POA: Diagnosis not present

## 2015-12-12 DIAGNOSIS — I872 Venous insufficiency (chronic) (peripheral): Secondary | ICD-10-CM | POA: Diagnosis not present

## 2015-12-12 DIAGNOSIS — Z87891 Personal history of nicotine dependence: Secondary | ICD-10-CM | POA: Diagnosis not present

## 2015-12-12 DIAGNOSIS — J449 Chronic obstructive pulmonary disease, unspecified: Secondary | ICD-10-CM | POA: Diagnosis not present

## 2015-12-19 DIAGNOSIS — I872 Venous insufficiency (chronic) (peripheral): Secondary | ICD-10-CM | POA: Diagnosis not present

## 2015-12-19 DIAGNOSIS — L97812 Non-pressure chronic ulcer of other part of right lower leg with fat layer exposed: Secondary | ICD-10-CM | POA: Diagnosis not present

## 2015-12-19 DIAGNOSIS — Z87891 Personal history of nicotine dependence: Secondary | ICD-10-CM | POA: Diagnosis not present

## 2015-12-19 DIAGNOSIS — J449 Chronic obstructive pulmonary disease, unspecified: Secondary | ICD-10-CM | POA: Diagnosis not present

## 2015-12-26 DIAGNOSIS — I872 Venous insufficiency (chronic) (peripheral): Secondary | ICD-10-CM | POA: Diagnosis not present

## 2015-12-26 DIAGNOSIS — L97812 Non-pressure chronic ulcer of other part of right lower leg with fat layer exposed: Secondary | ICD-10-CM | POA: Diagnosis not present

## 2015-12-26 DIAGNOSIS — Z87891 Personal history of nicotine dependence: Secondary | ICD-10-CM | POA: Diagnosis not present

## 2015-12-26 DIAGNOSIS — J449 Chronic obstructive pulmonary disease, unspecified: Secondary | ICD-10-CM | POA: Diagnosis not present

## 2016-01-01 DIAGNOSIS — Z23 Encounter for immunization: Secondary | ICD-10-CM | POA: Diagnosis not present

## 2016-01-02 DIAGNOSIS — Z87891 Personal history of nicotine dependence: Secondary | ICD-10-CM | POA: Diagnosis not present

## 2016-01-02 DIAGNOSIS — I872 Venous insufficiency (chronic) (peripheral): Secondary | ICD-10-CM | POA: Diagnosis not present

## 2016-01-02 DIAGNOSIS — L97812 Non-pressure chronic ulcer of other part of right lower leg with fat layer exposed: Secondary | ICD-10-CM | POA: Diagnosis not present

## 2016-01-02 DIAGNOSIS — J449 Chronic obstructive pulmonary disease, unspecified: Secondary | ICD-10-CM | POA: Diagnosis not present

## 2016-01-18 DIAGNOSIS — J441 Chronic obstructive pulmonary disease with (acute) exacerbation: Secondary | ICD-10-CM | POA: Diagnosis not present

## 2016-01-18 DIAGNOSIS — E559 Vitamin D deficiency, unspecified: Secondary | ICD-10-CM | POA: Diagnosis not present

## 2016-01-18 DIAGNOSIS — I10 Essential (primary) hypertension: Secondary | ICD-10-CM | POA: Diagnosis not present

## 2016-01-18 DIAGNOSIS — R7309 Other abnormal glucose: Secondary | ICD-10-CM | POA: Diagnosis not present

## 2016-01-18 DIAGNOSIS — I872 Venous insufficiency (chronic) (peripheral): Secondary | ICD-10-CM | POA: Diagnosis not present

## 2016-01-24 DIAGNOSIS — L97812 Non-pressure chronic ulcer of other part of right lower leg with fat layer exposed: Secondary | ICD-10-CM | POA: Diagnosis not present

## 2016-01-24 DIAGNOSIS — E559 Vitamin D deficiency, unspecified: Secondary | ICD-10-CM | POA: Diagnosis not present

## 2016-01-24 DIAGNOSIS — I872 Venous insufficiency (chronic) (peripheral): Secondary | ICD-10-CM | POA: Diagnosis not present

## 2016-01-24 DIAGNOSIS — Z87891 Personal history of nicotine dependence: Secondary | ICD-10-CM | POA: Diagnosis not present

## 2016-01-24 DIAGNOSIS — J449 Chronic obstructive pulmonary disease, unspecified: Secondary | ICD-10-CM | POA: Diagnosis not present

## 2016-01-24 DIAGNOSIS — R7309 Other abnormal glucose: Secondary | ICD-10-CM | POA: Diagnosis not present

## 2016-01-24 DIAGNOSIS — I10 Essential (primary) hypertension: Secondary | ICD-10-CM | POA: Diagnosis not present

## 2016-02-01 DIAGNOSIS — L97812 Non-pressure chronic ulcer of other part of right lower leg with fat layer exposed: Secondary | ICD-10-CM | POA: Diagnosis not present

## 2016-02-01 DIAGNOSIS — R0902 Hypoxemia: Secondary | ICD-10-CM | POA: Diagnosis not present

## 2016-02-01 DIAGNOSIS — R197 Diarrhea, unspecified: Secondary | ICD-10-CM | POA: Diagnosis not present

## 2016-02-01 DIAGNOSIS — J45909 Unspecified asthma, uncomplicated: Secondary | ICD-10-CM | POA: Diagnosis not present

## 2016-02-01 DIAGNOSIS — E039 Hypothyroidism, unspecified: Secondary | ICD-10-CM | POA: Diagnosis not present

## 2016-02-01 DIAGNOSIS — R7303 Prediabetes: Secondary | ICD-10-CM | POA: Diagnosis not present

## 2016-02-01 DIAGNOSIS — I10 Essential (primary) hypertension: Secondary | ICD-10-CM | POA: Diagnosis not present

## 2016-02-01 DIAGNOSIS — J441 Chronic obstructive pulmonary disease with (acute) exacerbation: Secondary | ICD-10-CM | POA: Diagnosis not present

## 2016-02-04 DIAGNOSIS — J449 Chronic obstructive pulmonary disease, unspecified: Secondary | ICD-10-CM | POA: Diagnosis not present

## 2016-02-04 DIAGNOSIS — Z87891 Personal history of nicotine dependence: Secondary | ICD-10-CM | POA: Diagnosis not present

## 2016-02-04 DIAGNOSIS — L97812 Non-pressure chronic ulcer of other part of right lower leg with fat layer exposed: Secondary | ICD-10-CM | POA: Diagnosis not present

## 2016-02-04 DIAGNOSIS — I872 Venous insufficiency (chronic) (peripheral): Secondary | ICD-10-CM | POA: Diagnosis not present

## 2016-02-13 DIAGNOSIS — J441 Chronic obstructive pulmonary disease with (acute) exacerbation: Secondary | ICD-10-CM | POA: Diagnosis not present

## 2016-02-13 DIAGNOSIS — H34832 Tributary (branch) retinal vein occlusion, left eye, with macular edema: Secondary | ICD-10-CM | POA: Diagnosis not present

## 2016-02-13 DIAGNOSIS — H43812 Vitreous degeneration, left eye: Secondary | ICD-10-CM | POA: Diagnosis not present

## 2016-02-13 DIAGNOSIS — H33192 Other retinoschisis and retinal cysts, left eye: Secondary | ICD-10-CM | POA: Diagnosis not present

## 2016-03-13 DIAGNOSIS — J441 Chronic obstructive pulmonary disease with (acute) exacerbation: Secondary | ICD-10-CM | POA: Diagnosis not present

## 2016-03-13 DIAGNOSIS — E039 Hypothyroidism, unspecified: Secondary | ICD-10-CM | POA: Diagnosis not present

## 2016-03-13 DIAGNOSIS — Z23 Encounter for immunization: Secondary | ICD-10-CM | POA: Diagnosis not present

## 2016-03-13 DIAGNOSIS — R0902 Hypoxemia: Secondary | ICD-10-CM | POA: Diagnosis not present

## 2016-03-13 DIAGNOSIS — I10 Essential (primary) hypertension: Secondary | ICD-10-CM | POA: Diagnosis not present

## 2016-04-02 DIAGNOSIS — J449 Chronic obstructive pulmonary disease, unspecified: Secondary | ICD-10-CM | POA: Diagnosis not present

## 2016-04-02 DIAGNOSIS — Z87891 Personal history of nicotine dependence: Secondary | ICD-10-CM | POA: Diagnosis not present

## 2016-04-30 DIAGNOSIS — H34832 Tributary (branch) retinal vein occlusion, left eye, with macular edema: Secondary | ICD-10-CM | POA: Diagnosis not present

## 2016-06-02 DIAGNOSIS — J9611 Chronic respiratory failure with hypoxia: Secondary | ICD-10-CM | POA: Diagnosis not present

## 2016-06-02 DIAGNOSIS — E039 Hypothyroidism, unspecified: Secondary | ICD-10-CM | POA: Diagnosis not present

## 2016-06-02 DIAGNOSIS — Z6834 Body mass index (BMI) 34.0-34.9, adult: Secondary | ICD-10-CM | POA: Diagnosis not present

## 2016-06-02 DIAGNOSIS — R7303 Prediabetes: Secondary | ICD-10-CM | POA: Diagnosis not present

## 2016-06-02 DIAGNOSIS — I10 Essential (primary) hypertension: Secondary | ICD-10-CM | POA: Diagnosis not present

## 2016-06-02 DIAGNOSIS — E6609 Other obesity due to excess calories: Secondary | ICD-10-CM | POA: Diagnosis not present

## 2016-06-02 DIAGNOSIS — J441 Chronic obstructive pulmonary disease with (acute) exacerbation: Secondary | ICD-10-CM | POA: Diagnosis not present

## 2016-07-24 DIAGNOSIS — H34832 Tributary (branch) retinal vein occlusion, left eye, with macular edema: Secondary | ICD-10-CM | POA: Diagnosis not present

## 2016-09-15 DIAGNOSIS — J449 Chronic obstructive pulmonary disease, unspecified: Secondary | ICD-10-CM | POA: Diagnosis not present

## 2016-09-15 DIAGNOSIS — J441 Chronic obstructive pulmonary disease with (acute) exacerbation: Secondary | ICD-10-CM | POA: Diagnosis not present

## 2016-09-15 DIAGNOSIS — R05 Cough: Secondary | ICD-10-CM | POA: Diagnosis not present

## 2016-09-19 DIAGNOSIS — J9611 Chronic respiratory failure with hypoxia: Secondary | ICD-10-CM | POA: Diagnosis not present

## 2016-09-19 DIAGNOSIS — J8410 Pulmonary fibrosis, unspecified: Secondary | ICD-10-CM | POA: Diagnosis not present

## 2016-09-19 DIAGNOSIS — I10 Essential (primary) hypertension: Secondary | ICD-10-CM | POA: Diagnosis not present

## 2016-09-19 DIAGNOSIS — J189 Pneumonia, unspecified organism: Secondary | ICD-10-CM | POA: Diagnosis not present

## 2016-09-19 DIAGNOSIS — J441 Chronic obstructive pulmonary disease with (acute) exacerbation: Secondary | ICD-10-CM | POA: Diagnosis not present

## 2016-09-29 DIAGNOSIS — J449 Chronic obstructive pulmonary disease, unspecified: Secondary | ICD-10-CM | POA: Diagnosis not present

## 2016-10-02 IMAGING — CR DG CHEST 2V
2 series · 2 of 2 positions shown · non-contrast
Comparison: Report of prior chest radiograph March 04, 2009
available ; images from that study not available.

CLINICAL DATA: COPD with intermittent dyspnea. Drug trial
participant

EXAM:
CHEST  2 VIEW

[view not recorded (1 of 2)]
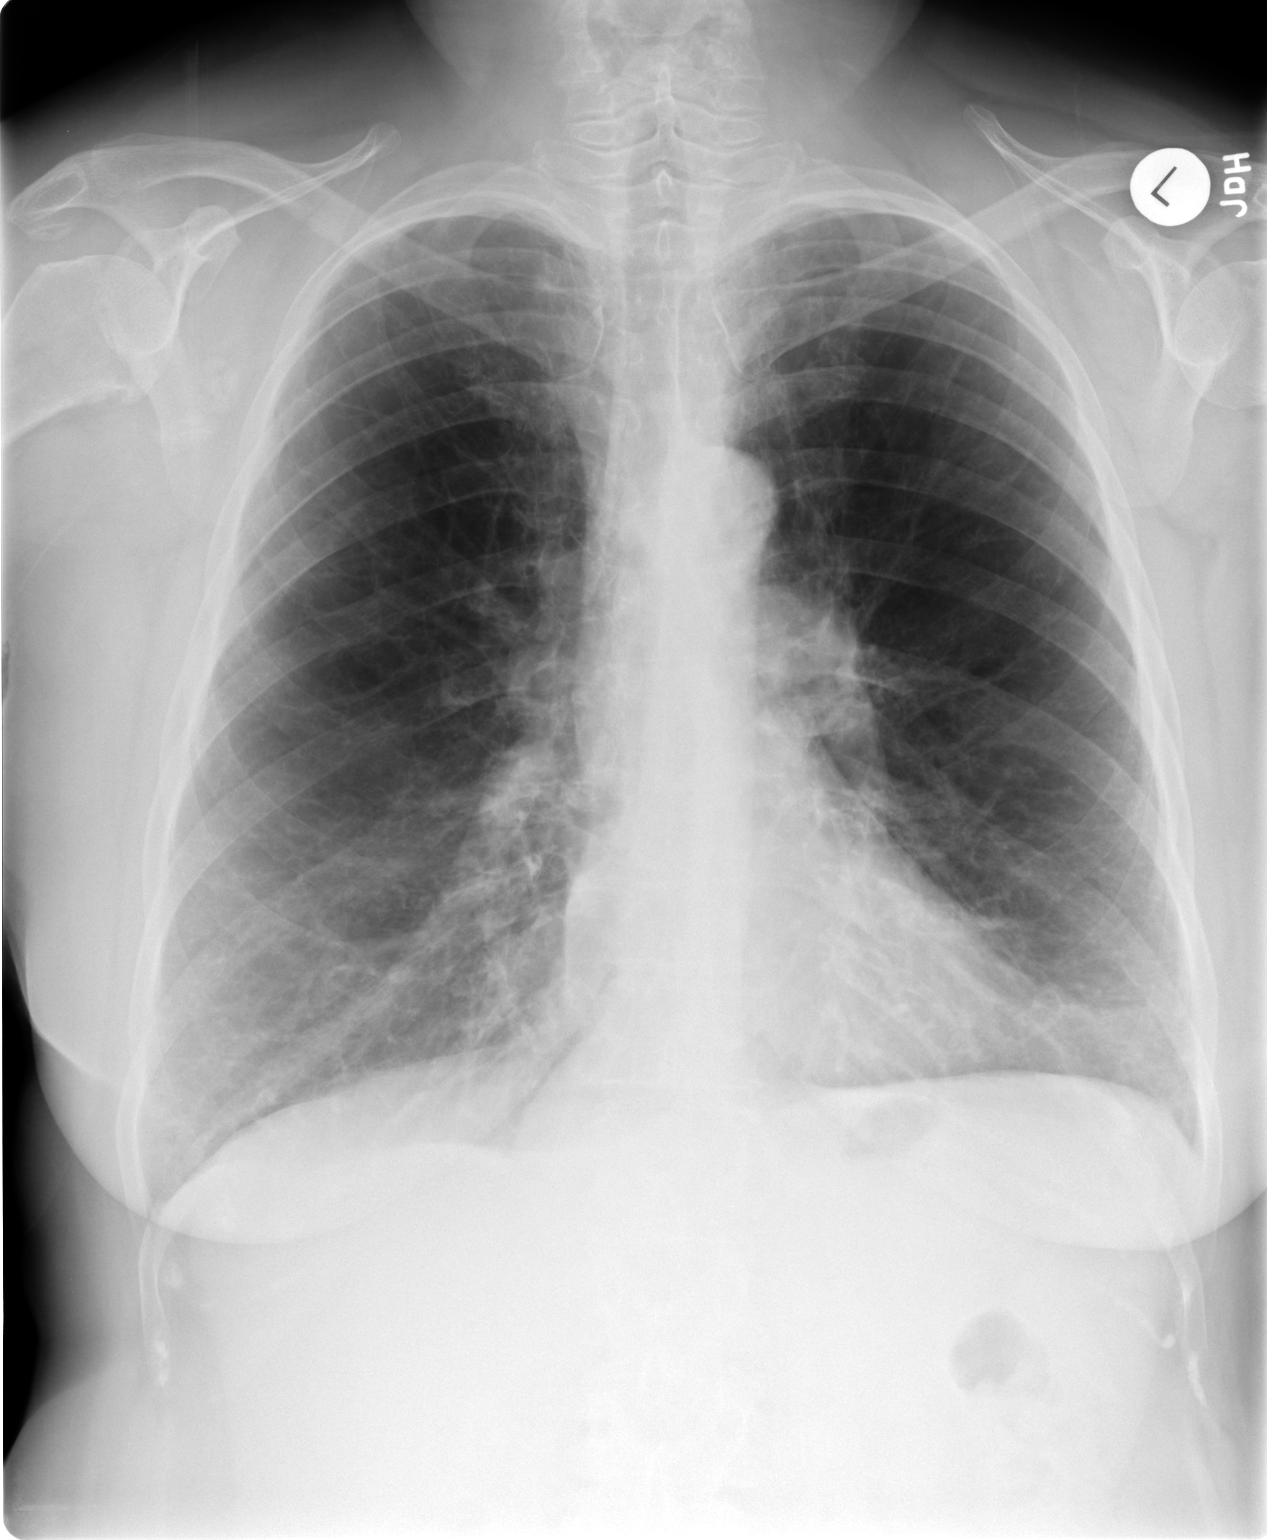

[view not recorded (2 of 2)]
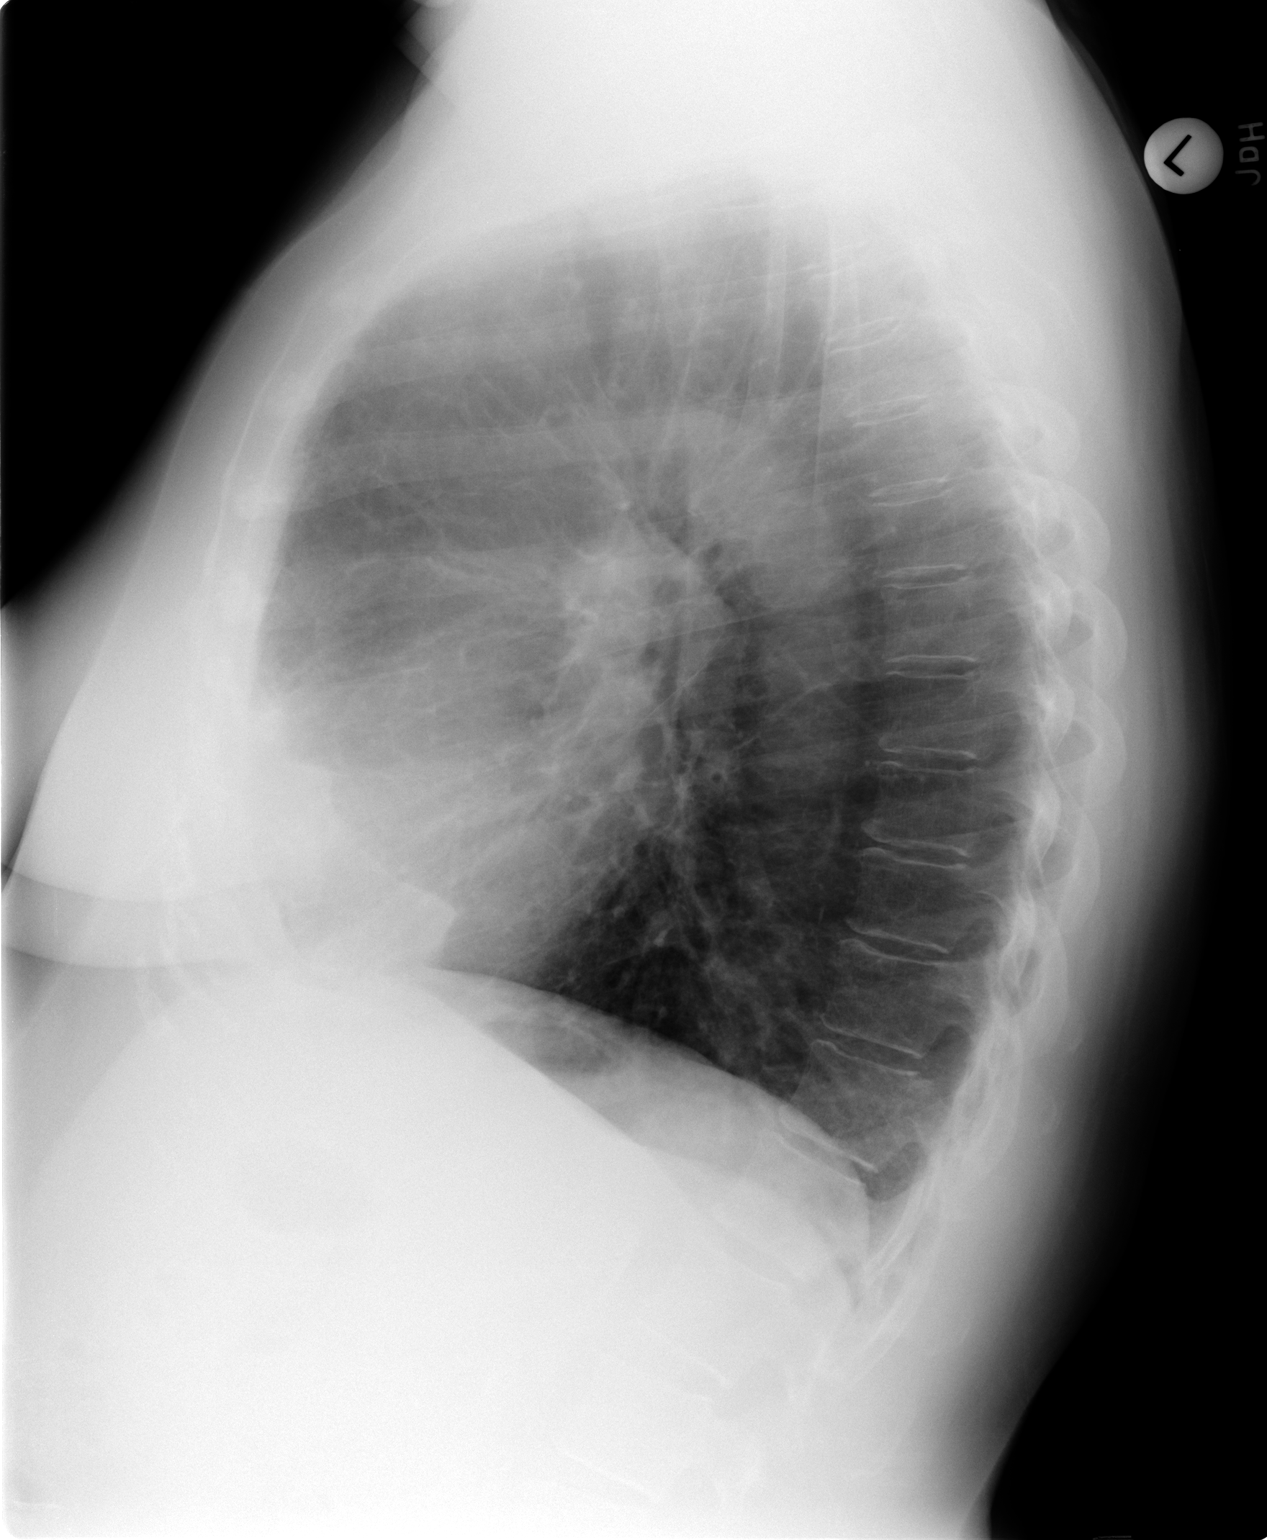

[2 of 2 positions shown; findings below may reference images not displayed]

FINDINGS: There is underlying emphysematous change. There is slight scarring
in the bases. There is no edema or consolidation. The heart size is
normal. Pulmonary vascularity reflects underlying emphysema. No
adenopathy. No bone lesions. There is synovial chondromatosis in the
right shoulder.
IMPRESSION: Underlying emphysematous change. Slight scarring in the bases. No
edema or consolidation.

## 2016-10-03 DIAGNOSIS — J8410 Pulmonary fibrosis, unspecified: Secondary | ICD-10-CM | POA: Diagnosis not present

## 2016-10-03 DIAGNOSIS — E039 Hypothyroidism, unspecified: Secondary | ICD-10-CM | POA: Diagnosis not present

## 2016-10-03 DIAGNOSIS — J441 Chronic obstructive pulmonary disease with (acute) exacerbation: Secondary | ICD-10-CM | POA: Diagnosis not present

## 2016-10-03 DIAGNOSIS — I10 Essential (primary) hypertension: Secondary | ICD-10-CM | POA: Diagnosis not present

## 2016-10-03 DIAGNOSIS — J449 Chronic obstructive pulmonary disease, unspecified: Secondary | ICD-10-CM | POA: Diagnosis not present

## 2016-10-03 DIAGNOSIS — Z6834 Body mass index (BMI) 34.0-34.9, adult: Secondary | ICD-10-CM | POA: Diagnosis not present

## 2016-10-03 DIAGNOSIS — E6609 Other obesity due to excess calories: Secondary | ICD-10-CM | POA: Diagnosis not present

## 2016-10-09 DIAGNOSIS — H33191 Other retinoschisis and retinal cysts, right eye: Secondary | ICD-10-CM | POA: Diagnosis not present

## 2016-10-09 DIAGNOSIS — H33192 Other retinoschisis and retinal cysts, left eye: Secondary | ICD-10-CM | POA: Diagnosis not present

## 2016-10-09 DIAGNOSIS — H34832 Tributary (branch) retinal vein occlusion, left eye, with macular edema: Secondary | ICD-10-CM | POA: Diagnosis not present

## 2016-11-20 DIAGNOSIS — E038 Other specified hypothyroidism: Secondary | ICD-10-CM | POA: Diagnosis not present

## 2016-11-20 DIAGNOSIS — I10 Essential (primary) hypertension: Secondary | ICD-10-CM | POA: Diagnosis not present

## 2016-11-20 DIAGNOSIS — M810 Age-related osteoporosis without current pathological fracture: Secondary | ICD-10-CM | POA: Diagnosis not present

## 2016-11-20 DIAGNOSIS — Z79899 Other long term (current) drug therapy: Secondary | ICD-10-CM | POA: Diagnosis not present

## 2016-11-20 DIAGNOSIS — J449 Chronic obstructive pulmonary disease, unspecified: Secondary | ICD-10-CM | POA: Diagnosis not present

## 2016-11-25 DIAGNOSIS — E038 Other specified hypothyroidism: Secondary | ICD-10-CM | POA: Diagnosis not present

## 2016-11-25 DIAGNOSIS — Z79899 Other long term (current) drug therapy: Secondary | ICD-10-CM | POA: Diagnosis not present

## 2016-11-25 DIAGNOSIS — I10 Essential (primary) hypertension: Secondary | ICD-10-CM | POA: Diagnosis not present

## 2016-11-25 DIAGNOSIS — J449 Chronic obstructive pulmonary disease, unspecified: Secondary | ICD-10-CM | POA: Diagnosis not present

## 2016-11-25 DIAGNOSIS — M810 Age-related osteoporosis without current pathological fracture: Secondary | ICD-10-CM | POA: Diagnosis not present

## 2016-12-03 DIAGNOSIS — M810 Age-related osteoporosis without current pathological fracture: Secondary | ICD-10-CM | POA: Diagnosis not present

## 2016-12-25 DIAGNOSIS — Z23 Encounter for immunization: Secondary | ICD-10-CM | POA: Diagnosis not present

## 2016-12-25 DIAGNOSIS — Z0000 Encounter for general adult medical examination without abnormal findings: Secondary | ICD-10-CM | POA: Diagnosis not present

## 2016-12-25 DIAGNOSIS — I10 Essential (primary) hypertension: Secondary | ICD-10-CM | POA: Diagnosis not present

## 2016-12-25 DIAGNOSIS — Z6833 Body mass index (BMI) 33.0-33.9, adult: Secondary | ICD-10-CM | POA: Diagnosis not present

## 2016-12-25 DIAGNOSIS — E669 Obesity, unspecified: Secondary | ICD-10-CM | POA: Diagnosis not present

## 2016-12-25 DIAGNOSIS — J449 Chronic obstructive pulmonary disease, unspecified: Secondary | ICD-10-CM | POA: Diagnosis not present

## 2017-01-07 DIAGNOSIS — H34832 Tributary (branch) retinal vein occlusion, left eye, with macular edema: Secondary | ICD-10-CM | POA: Diagnosis not present

## 2017-03-02 DIAGNOSIS — J449 Chronic obstructive pulmonary disease, unspecified: Secondary | ICD-10-CM | POA: Diagnosis not present

## 2017-03-03 DIAGNOSIS — M7061 Trochanteric bursitis, right hip: Secondary | ICD-10-CM | POA: Diagnosis not present

## 2017-11-25 IMAGING — MG MM DIGITAL SCREENING BILAT W/ TOMO W/ CAD
8 series · 8 of 24 positions shown · non-contrast
Comparison: Previous exam(s).

CLINICAL DATA: Screening.

EXAM:
DIGITAL SCREENING BILATERAL MAMMOGRAM WITH 3D TOMO WITH CAD

[R CC]
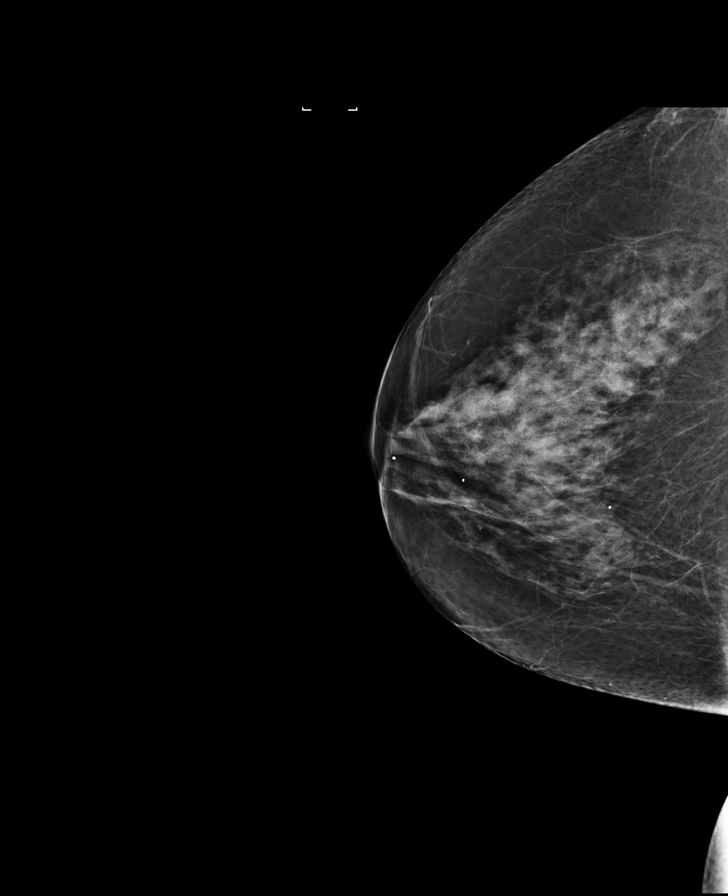

[R MLO]
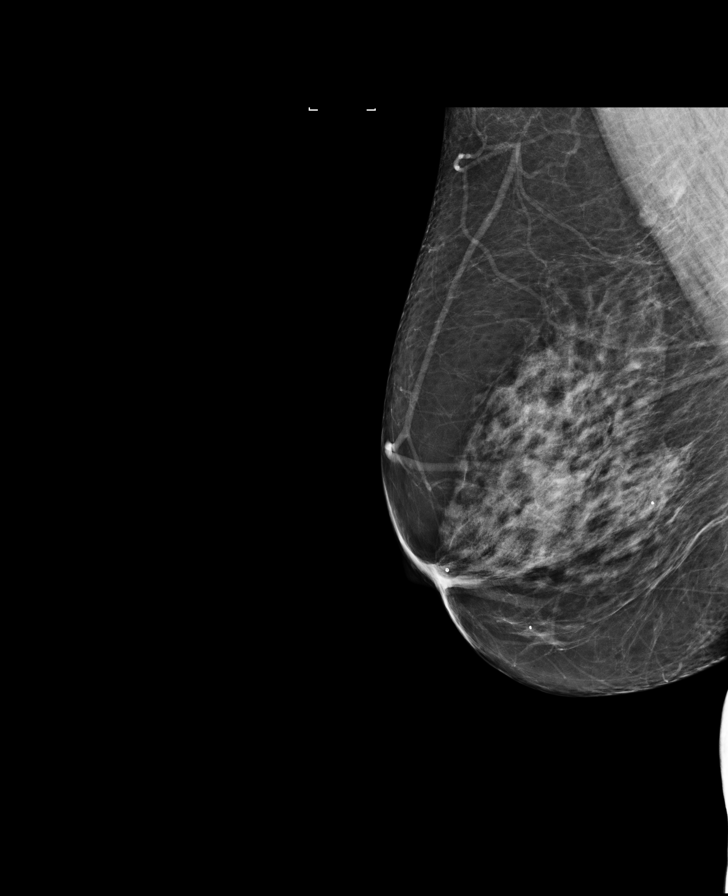

[L MLO]
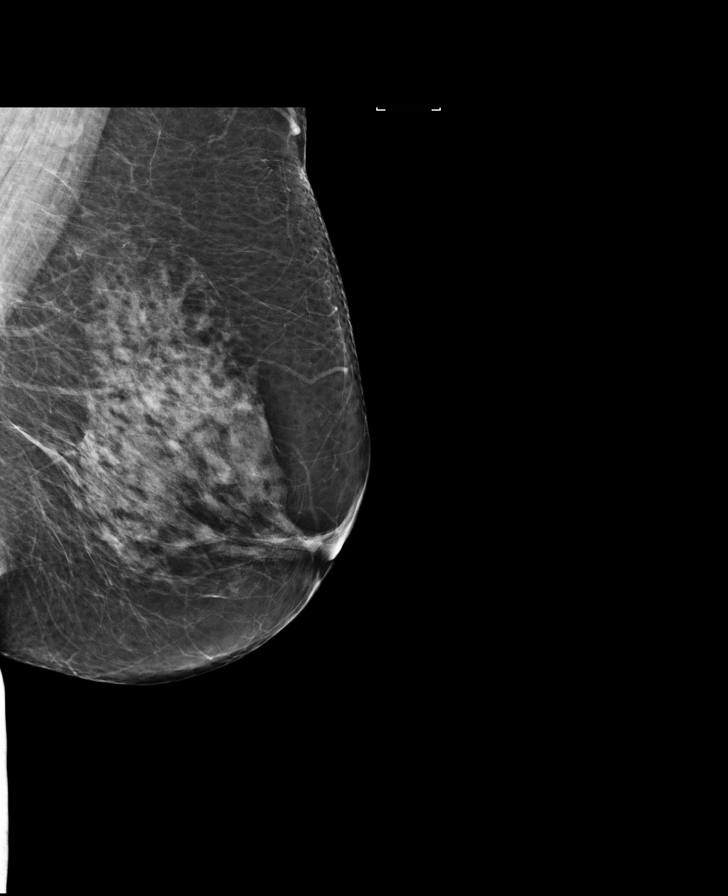

[L CC]
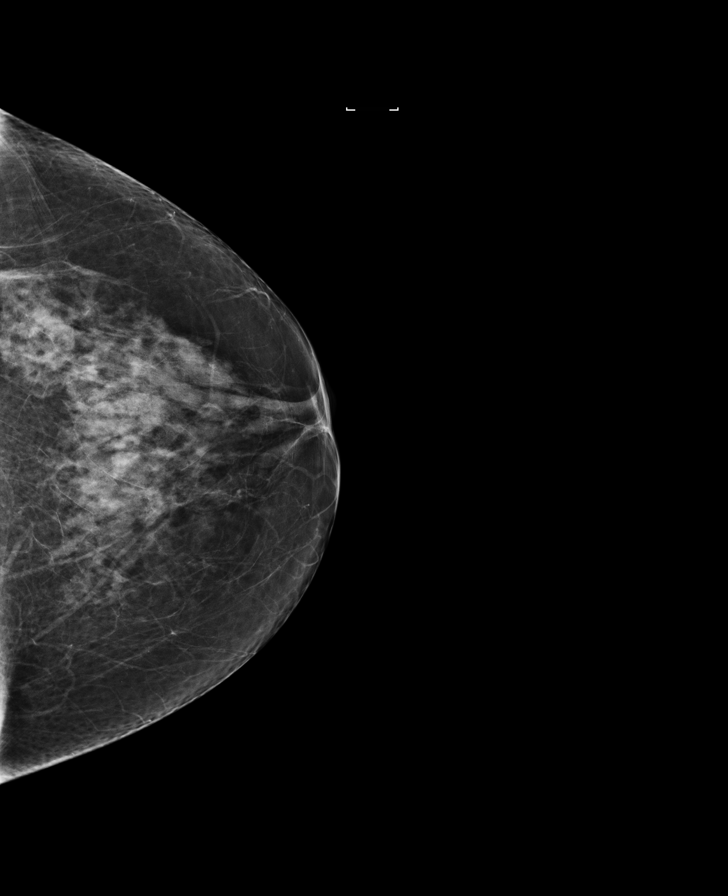

[R MLO tomo · tomo slice 38/75.0]
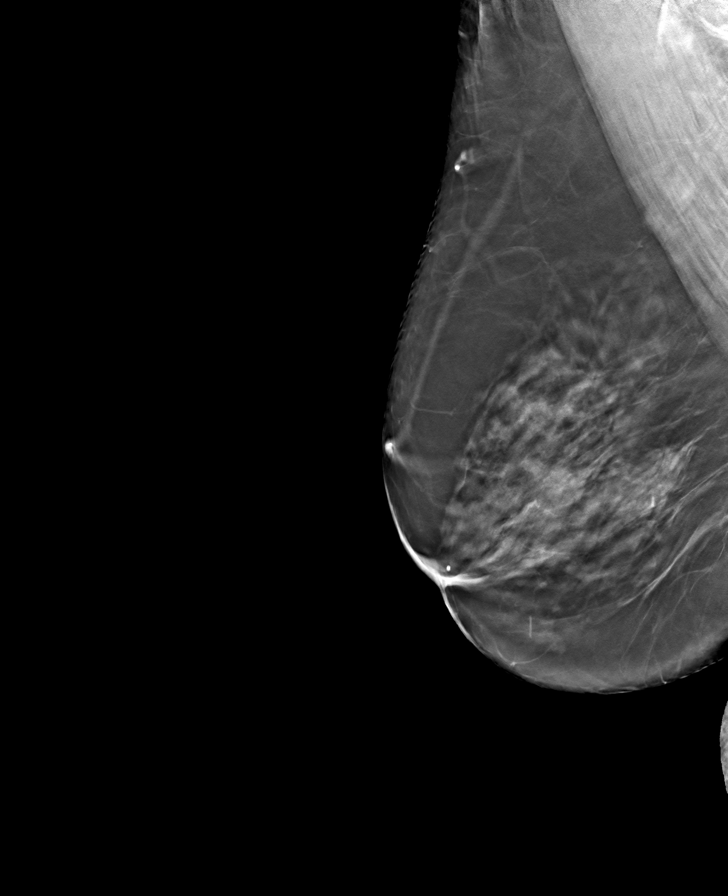

[L MLO tomo · tomo slice 34/67.0]
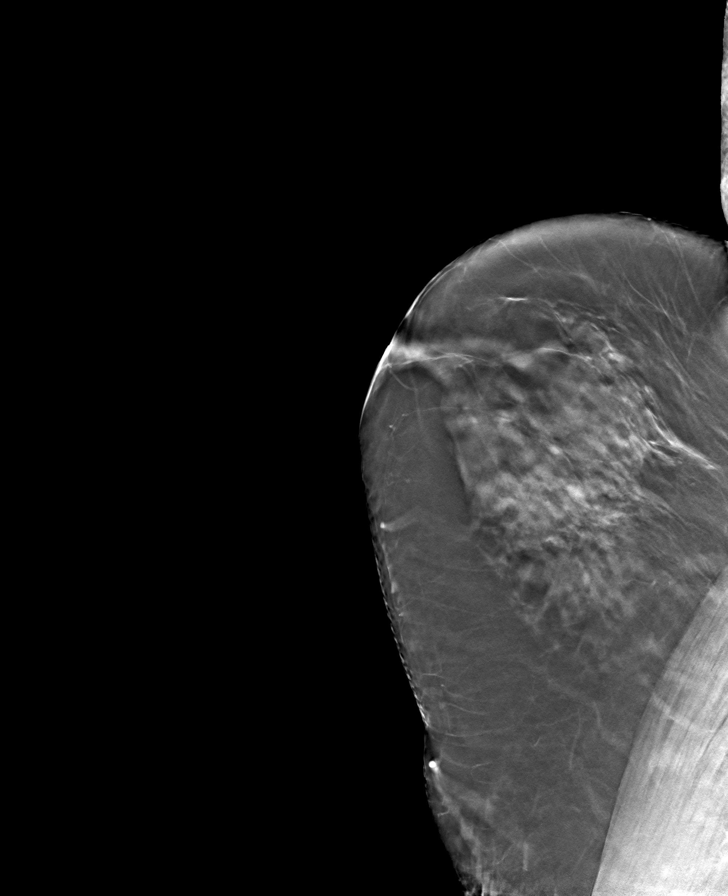

[L CC tomo · tomo slice 34/67.0]
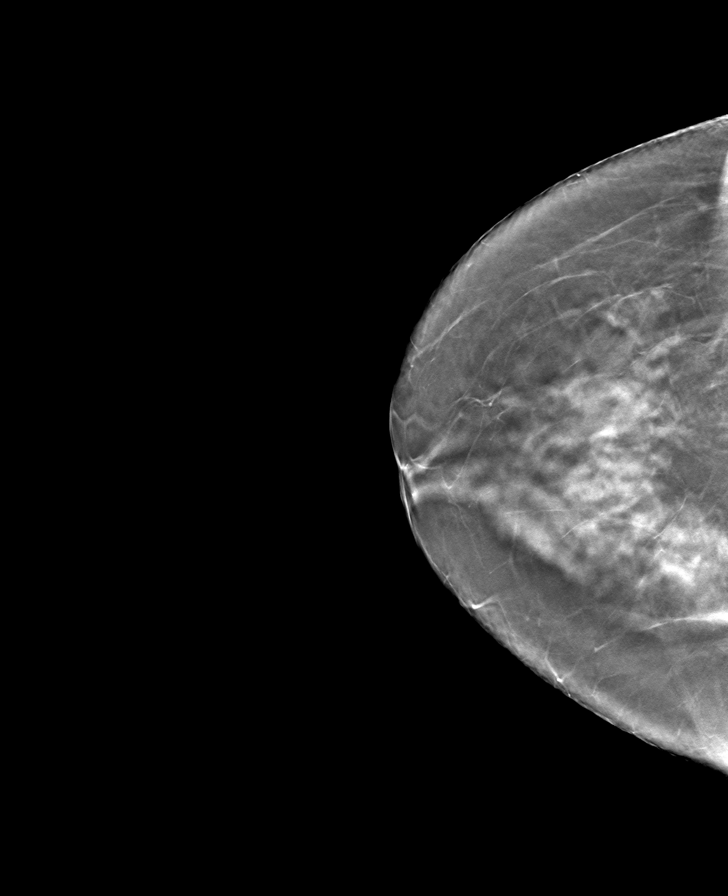

[R CC tomo · tomo slice 37/73.0]
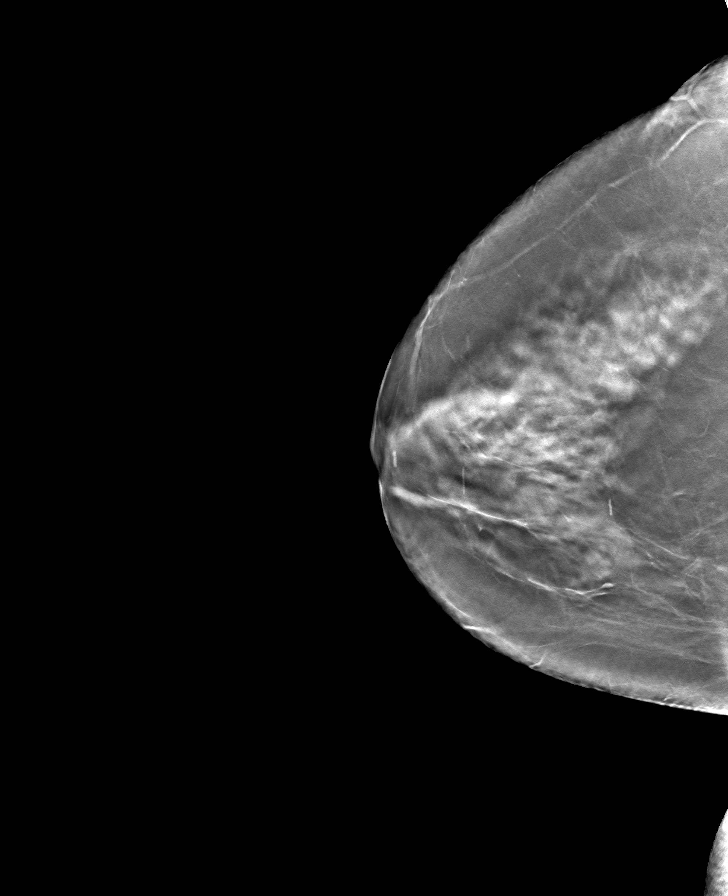

[8 of 24 positions shown; findings below may reference images not displayed]

ACR Breast Density Category c: The breast tissue is heterogeneously
dense, which may obscure small masses.
FINDINGS: There are no findings suspicious for malignancy. Images were
processed with CAD.
IMPRESSION: No mammographic evidence of malignancy. A result letter of this
screening mammogram will be mailed directly to the patient.

RECOMMENDATION:
Screening mammogram in one year. (Code:OA-G-1SS)

BI-RADS CATEGORY  1: Negative.

## 2019-04-11 ENCOUNTER — Telehealth: Payer: Self-pay | Admitting: Unknown Physician Specialty

## 2019-04-11 ENCOUNTER — Other Ambulatory Visit: Payer: Self-pay | Admitting: Unknown Physician Specialty

## 2019-04-11 DIAGNOSIS — U071 COVID-19: Secondary | ICD-10-CM

## 2019-04-11 NOTE — Telephone Encounter (Signed)
  I connected by phone with Meghan [name] on 04/11/2019 at 1:08 PM to discuss the potential use of an new treatment for mild to moderate COVID-19 viral infection in non-hospitalized patients.  This patient is a 69 y.o. female that meets the FDA criteria for Emergency Use Authorization of bamlanivimab or casirivimab\imdevimab.  Has a (+) direct SARS-CoV-2 viral test result  Has mild or moderate COVID-19   Is ? 69 years of age and weighs ? 40 kg  Is NOT hospitalized due to COVID-19  Is NOT requiring oxygen therapy or requiring an increase in baseline oxygen flow rate due to COVID-19  Is within 10 days of symptom onset  Has at least one of the high risk factor(s) for progression to severe COVID-19 and/or hospitalization as defined in EUA.  Specific high risk criteria : >/= 71 yo with COPD.  Managed by pulmonologist   I have spoken and communicated the following to the patient or parent/caregiver:  1. FDA has authorized the emergency use of bamlanivimab and casirivimab\imdevimab for the treatment of mild to moderate COVID-19 in adults and pediatric patients with positive results of direct SARS-CoV-2 viral testing who are 53 years of age and older weighing at least 40 kg, and who are at high risk for progressing to severe COVID-19 and/or hospitalization.  2. The significant known and potential risks and benefits of bamlanivimab and casirivimab\imdevimab, and the extent to which such potential risks and benefits are unknown.  3. Information on available alternative treatments and the risks and benefits of those alternatives, including clinical trials.  4. Patients treated with bamlanivimab and casirivimab\imdevimab should continue to self-isolate and use infection control measures (e.g., wear mask, isolate, social distance, avoid sharing personal items, clean and disinfect "high touch" surfaces, and frequent handwashing) according to CDC guidelines.   5. The patient or parent/caregiver has  the option to accept or refuse bamlanivimab or casirivimab\imdevimab .  After reviewing this information with the patient, The patient agreed to proceed with receiving the bamlanimivab infusion and will be provided a copy of the Fact sheet prior to receiving the infusion.Meghan [name] 04/11/2019 1:08 PM Sx onset 1/8 on no O2. Pulse ox on the phone was 92 which is typical for her.

## 2019-04-13 ENCOUNTER — Ambulatory Visit (HOSPITAL_COMMUNITY): Admission: RE | Admit: 2019-04-13 | Payer: Medicare Other | Source: Ambulatory Visit

## 2022-07-30 DEATH — deceased
